# Patient Record
Sex: Male | Born: 1935 | Race: White | Hispanic: No | Smoking: Former smoker
Health system: Southern US, Community
[De-identification: ages and names within clinical notes are randomized; demographics above are authoritative.]

## PROBLEM LIST (undated history)

## (undated) DIAGNOSIS — I10 Essential (primary) hypertension: Secondary | ICD-10-CM

## (undated) DIAGNOSIS — I442 Atrioventricular block, complete: Secondary | ICD-10-CM

## (undated) DIAGNOSIS — N289 Disorder of kidney and ureter, unspecified: Secondary | ICD-10-CM

## (undated) DIAGNOSIS — F17201 Nicotine dependence, unspecified, in remission: Secondary | ICD-10-CM

## (undated) DIAGNOSIS — I050 Rheumatic mitral stenosis: Secondary | ICD-10-CM

## (undated) DIAGNOSIS — M1710 Unilateral primary osteoarthritis, unspecified knee: Secondary | ICD-10-CM

## (undated) DIAGNOSIS — K219 Gastro-esophageal reflux disease without esophagitis: Secondary | ICD-10-CM

## (undated) DIAGNOSIS — I350 Nonrheumatic aortic (valve) stenosis: Secondary | ICD-10-CM

## (undated) DIAGNOSIS — R55 Syncope and collapse: Secondary | ICD-10-CM

## (undated) DIAGNOSIS — I2510 Atherosclerotic heart disease of native coronary artery without angina pectoris: Principal | ICD-10-CM

## (undated) DIAGNOSIS — C801 Malignant (primary) neoplasm, unspecified: Secondary | ICD-10-CM

## (undated) DIAGNOSIS — T1490XA Injury, unspecified, initial encounter: Secondary | ICD-10-CM

## (undated) DIAGNOSIS — E785 Hyperlipidemia, unspecified: Secondary | ICD-10-CM

## (undated) DIAGNOSIS — M179 Osteoarthritis of knee, unspecified: Secondary | ICD-10-CM

## (undated) HISTORY — DX: Nicotine dependence, unspecified, in remission: F17.201

## (undated) HISTORY — DX: Essential (primary) hypertension: I10

## (undated) HISTORY — PX: LUMBAR SPINE SURGERY: SHX701

## (undated) HISTORY — DX: Injury, unspecified, initial encounter: T14.90XA

## (undated) HISTORY — DX: Hyperlipidemia, unspecified: E78.5

## (undated) HISTORY — DX: Nonrheumatic aortic (valve) stenosis: I35.0

## (undated) HISTORY — DX: Atherosclerotic heart disease of native coronary artery without angina pectoris: I25.10

## (undated) HISTORY — DX: Unilateral primary osteoarthritis, unspecified knee: M17.10

## (undated) HISTORY — DX: Syncope and collapse: R55

## (undated) HISTORY — DX: Osteoarthritis of knee, unspecified: M17.9

---

## 1999-12-03 ENCOUNTER — Emergency Department (HOSPITAL_COMMUNITY): Admission: EM | Admit: 1999-12-03 | Discharge: 1999-12-03 | Payer: Self-pay | Admitting: Emergency Medicine

## 1999-12-16 ENCOUNTER — Encounter: Payer: Self-pay | Admitting: Neurosurgery

## 1999-12-16 ENCOUNTER — Ambulatory Visit (HOSPITAL_COMMUNITY): Admission: RE | Admit: 1999-12-16 | Discharge: 1999-12-16 | Payer: Self-pay | Admitting: Neurosurgery

## 2000-01-01 ENCOUNTER — Ambulatory Visit (HOSPITAL_COMMUNITY): Admission: RE | Admit: 2000-01-01 | Discharge: 2000-01-01 | Payer: Self-pay | Admitting: Neurosurgery

## 2000-01-01 ENCOUNTER — Encounter: Payer: Self-pay | Admitting: Neurosurgery

## 2000-01-20 ENCOUNTER — Encounter: Payer: Self-pay | Admitting: Neurosurgery

## 2000-01-22 ENCOUNTER — Encounter: Payer: Self-pay | Admitting: Neurosurgery

## 2000-01-22 ENCOUNTER — Inpatient Hospital Stay (HOSPITAL_COMMUNITY): Admission: RE | Admit: 2000-01-22 | Discharge: 2000-01-26 | Payer: Self-pay | Admitting: Neurosurgery

## 2000-07-28 DIAGNOSIS — I2510 Atherosclerotic heart disease of native coronary artery without angina pectoris: Secondary | ICD-10-CM

## 2000-07-28 HISTORY — DX: Atherosclerotic heart disease of native coronary artery without angina pectoris: I25.10

## 2001-04-20 ENCOUNTER — Inpatient Hospital Stay (HOSPITAL_COMMUNITY): Admission: AD | Admit: 2001-04-20 | Discharge: 2001-04-22 | Payer: Self-pay | Admitting: Cardiology

## 2001-07-28 HISTORY — PX: CORONARY ARTERY BYPASS GRAFT: SHX141

## 2001-10-05 ENCOUNTER — Inpatient Hospital Stay (HOSPITAL_COMMUNITY): Admission: AD | Admit: 2001-10-05 | Discharge: 2001-10-12 | Payer: Self-pay | Admitting: Cardiovascular Disease

## 2001-10-08 ENCOUNTER — Encounter: Payer: Self-pay | Admitting: Surgery

## 2001-10-09 ENCOUNTER — Encounter: Payer: Self-pay | Admitting: Surgery

## 2001-10-10 ENCOUNTER — Encounter: Payer: Self-pay | Admitting: Thoracic Surgery (Cardiothoracic Vascular Surgery)

## 2001-11-11 ENCOUNTER — Inpatient Hospital Stay (HOSPITAL_COMMUNITY): Admission: EM | Admit: 2001-11-11 | Discharge: 2001-11-12 | Payer: Self-pay | Admitting: Emergency Medicine

## 2003-07-29 DIAGNOSIS — T1490XA Injury, unspecified, initial encounter: Secondary | ICD-10-CM

## 2003-07-29 HISTORY — DX: Injury, unspecified, initial encounter: T14.90XA

## 2003-07-29 HISTORY — PX: EXPLORATORY LAPAROTOMY W/ BOWEL RESECTION: SHX1544

## 2004-04-16 ENCOUNTER — Ambulatory Visit: Payer: Self-pay | Admitting: Physical Medicine & Rehabilitation

## 2004-04-16 ENCOUNTER — Inpatient Hospital Stay (HOSPITAL_COMMUNITY): Admission: AC | Admit: 2004-04-16 | Discharge: 2004-05-29 | Payer: Self-pay

## 2004-04-16 ENCOUNTER — Ambulatory Visit: Payer: Self-pay | Admitting: Internal Medicine

## 2004-04-30 ENCOUNTER — Encounter: Payer: Self-pay | Admitting: Cardiovascular Disease

## 2004-06-26 ENCOUNTER — Ambulatory Visit: Payer: Self-pay | Admitting: Family Medicine

## 2004-10-14 ENCOUNTER — Ambulatory Visit: Payer: Self-pay | Admitting: Family Medicine

## 2005-01-14 ENCOUNTER — Ambulatory Visit: Payer: Self-pay | Admitting: Family Medicine

## 2005-03-10 ENCOUNTER — Ambulatory Visit: Payer: Self-pay | Admitting: Cardiology

## 2005-03-13 ENCOUNTER — Ambulatory Visit: Payer: Self-pay | Admitting: Cardiology

## 2005-03-28 ENCOUNTER — Ambulatory Visit: Payer: Self-pay | Admitting: Family Medicine

## 2005-04-16 ENCOUNTER — Ambulatory Visit: Payer: Self-pay | Admitting: Family Medicine

## 2005-04-27 HISTORY — PX: TOTAL KNEE ARTHROPLASTY: SHX125

## 2005-05-09 ENCOUNTER — Inpatient Hospital Stay (HOSPITAL_COMMUNITY): Admission: RE | Admit: 2005-05-09 | Discharge: 2005-05-13 | Payer: Self-pay | Admitting: Orthopedic Surgery

## 2005-06-17 ENCOUNTER — Ambulatory Visit: Payer: Self-pay | Admitting: Family Medicine

## 2005-08-06 ENCOUNTER — Encounter: Admission: RE | Admit: 2005-08-06 | Discharge: 2005-08-06 | Payer: Self-pay | Admitting: Orthopedic Surgery

## 2005-09-17 ENCOUNTER — Ambulatory Visit: Payer: Self-pay | Admitting: Family Medicine

## 2005-10-08 ENCOUNTER — Ambulatory Visit: Payer: Self-pay | Admitting: Family Medicine

## 2006-01-08 ENCOUNTER — Ambulatory Visit: Payer: Self-pay | Admitting: Family Medicine

## 2006-02-09 ENCOUNTER — Ambulatory Visit: Payer: Self-pay | Admitting: Family Medicine

## 2006-03-09 ENCOUNTER — Ambulatory Visit: Payer: Self-pay | Admitting: Family Medicine

## 2006-03-12 ENCOUNTER — Ambulatory Visit: Payer: Self-pay | Admitting: *Deleted

## 2006-03-12 ENCOUNTER — Inpatient Hospital Stay (HOSPITAL_COMMUNITY): Admission: EM | Admit: 2006-03-12 | Discharge: 2006-03-13 | Payer: Self-pay | Admitting: Emergency Medicine

## 2006-04-20 ENCOUNTER — Ambulatory Visit: Payer: Self-pay | Admitting: Family Medicine

## 2006-04-29 ENCOUNTER — Ambulatory Visit: Payer: Self-pay | Admitting: Family Medicine

## 2006-06-08 ENCOUNTER — Ambulatory Visit: Payer: Self-pay | Admitting: Family Medicine

## 2006-09-11 ENCOUNTER — Ambulatory Visit: Payer: Self-pay | Admitting: Family Medicine

## 2006-09-24 ENCOUNTER — Ambulatory Visit (HOSPITAL_COMMUNITY): Admission: RE | Admit: 2006-09-24 | Discharge: 2006-09-24 | Payer: Self-pay | Admitting: Ophthalmology

## 2006-09-26 IMAGING — CT CT ANGIO CHEST
1 series · 19 of 32 positions shown · IV contrast (omniscan)
Comparison: Chest radiograph 03/11/2006.

CLINICAL DATA: Shortness of breath, weakness.  
CT ANGIOGRAPHY OF CHEST:
TECHNIQUE: Multidetector CT imaging of the chest was performed during bolus injection of intravenous contrast.  Multiplanar CT angiographic image reconstructions were generated to evaluate the vascular anatomy.
Contrast:  100 cc Omniscan 300 IV

[Series 4: pulm embolism 2.0 st · axial · 0.69mm/px · z∈[+1267,+1569]mm · 19 of 163 slices shown]
[im 6/163  lung]
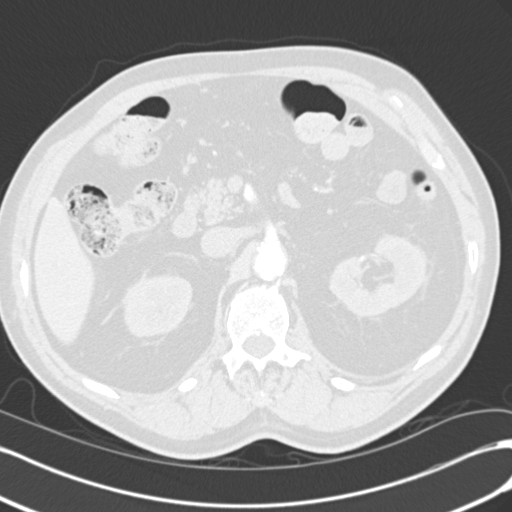
[im 16/163  soft-tissue]
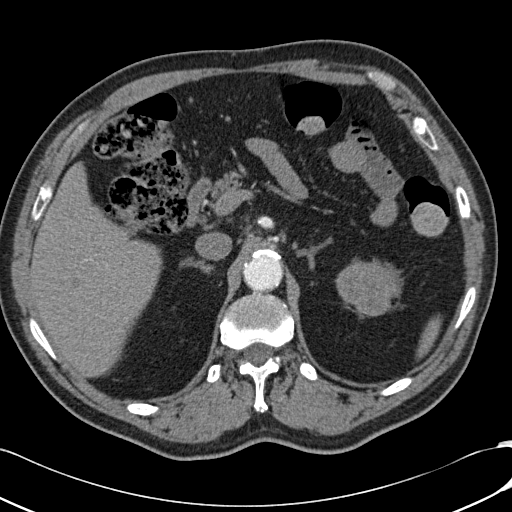
[im 21/163  lung]
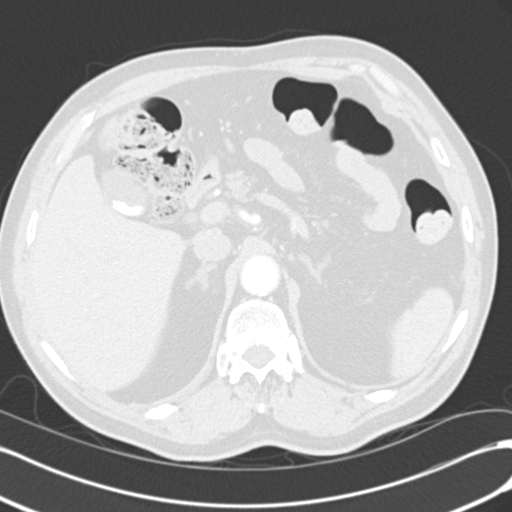
[im 32/163  soft-tissue]
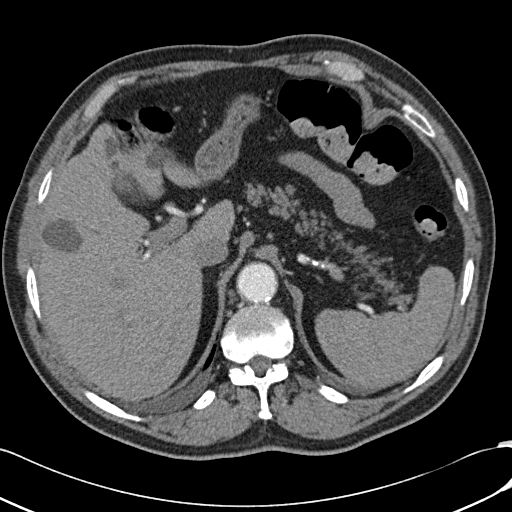
[im 42/163  lung]
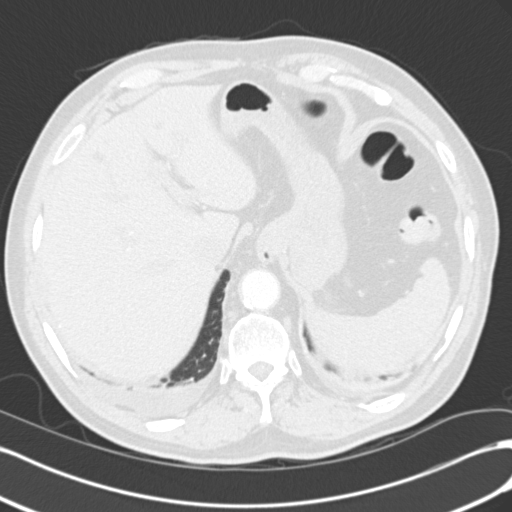
[im 48/163  soft-tissue]
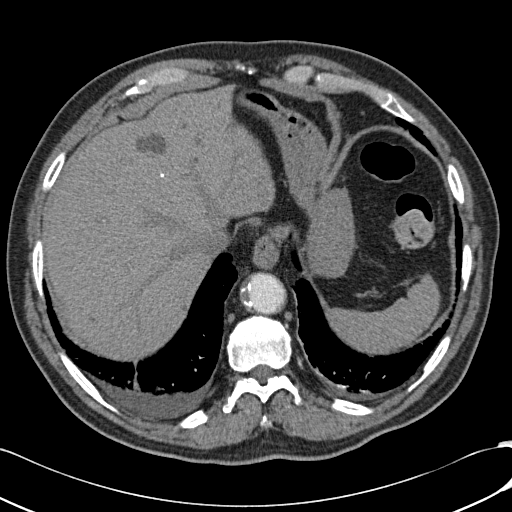
[im 58/163  lung]
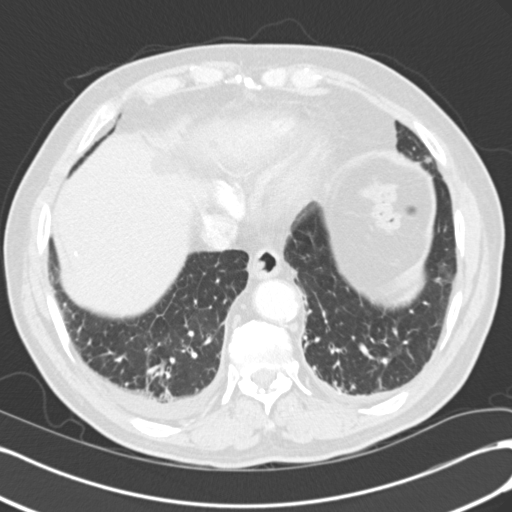
[im 63/163  soft-tissue]
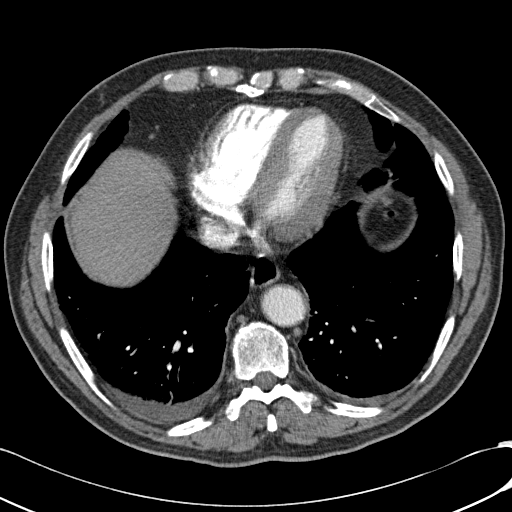
[im 74/163  lung]
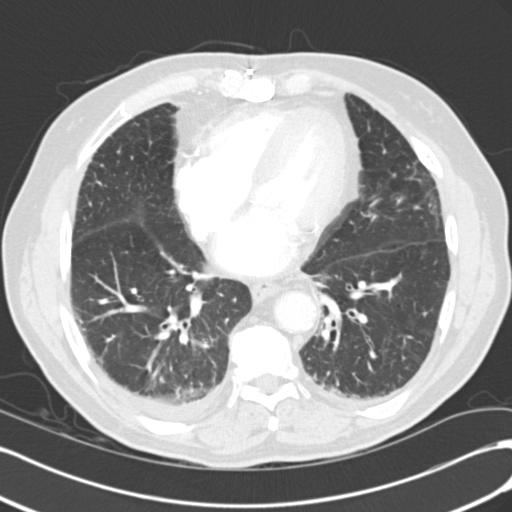
[im 84/163  soft-tissue]
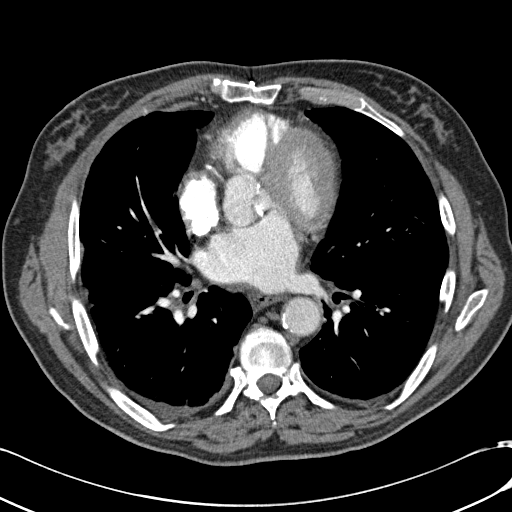
[im 89/163  lung]
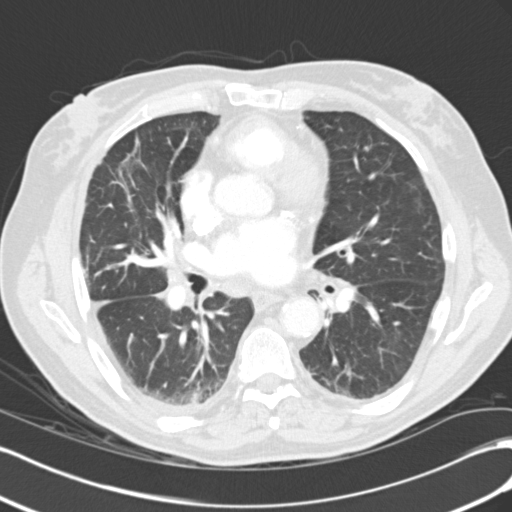
[im 100/163  soft-tissue]
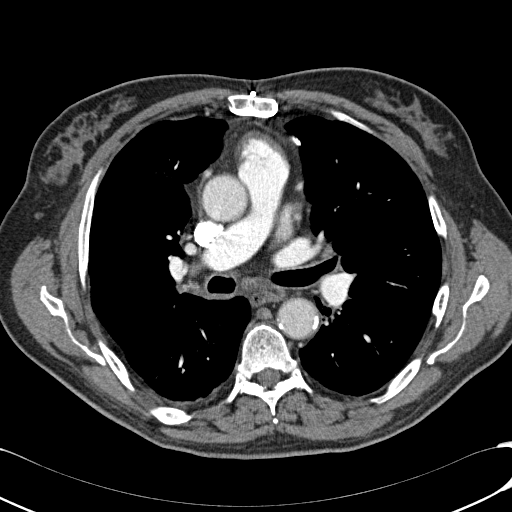
[im 105/163  lung]
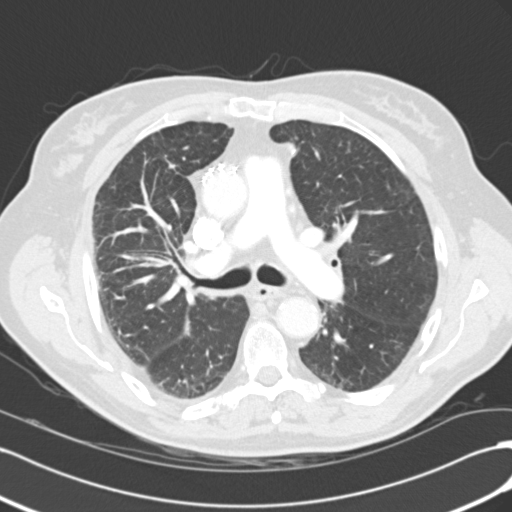
[im 115/163  soft-tissue]
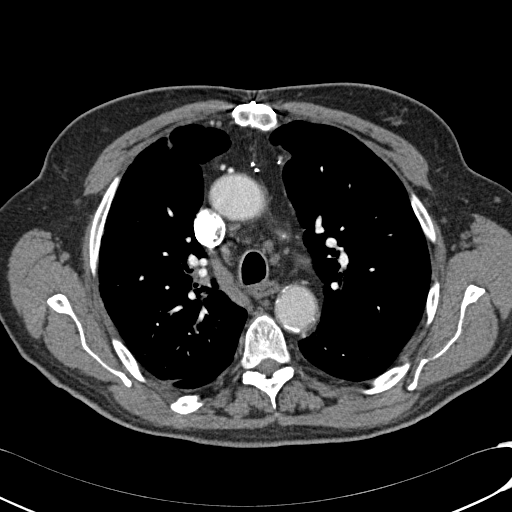
[im 121/163  lung]
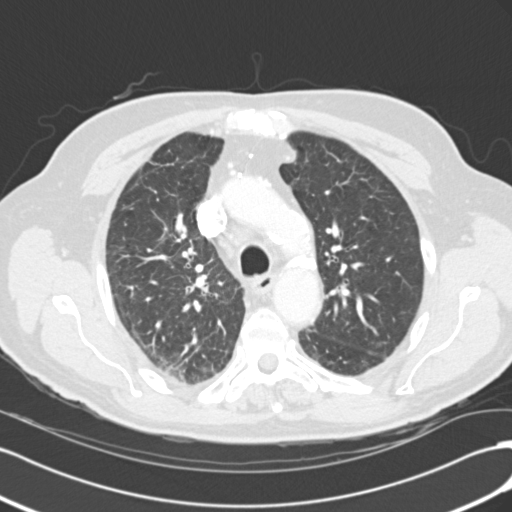
[im 131/163  soft-tissue]
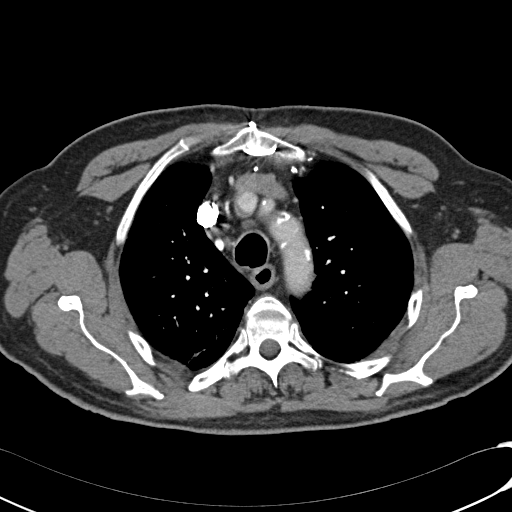
[im 142/163  lung]
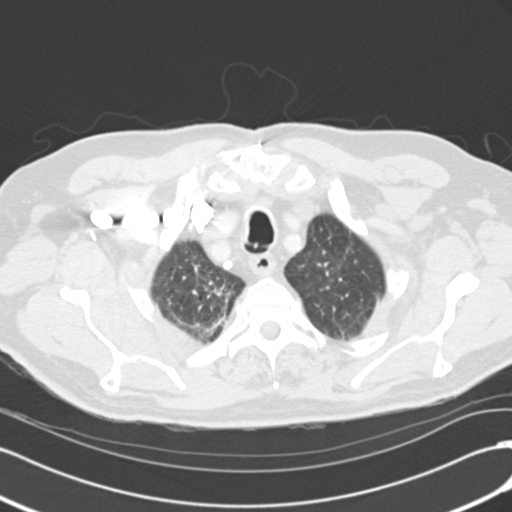
[im 147/163  soft-tissue]
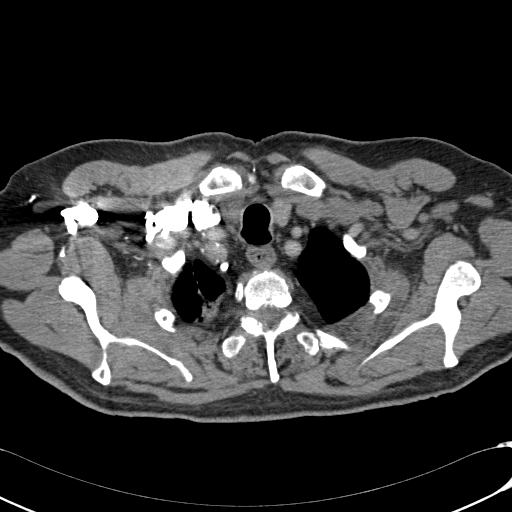
[im 157/163  lung]
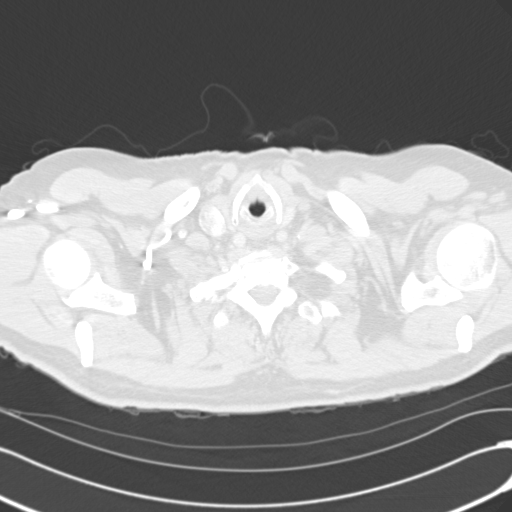

[19 of 32 positions shown; findings below may reference images not displayed]

FINDINGS: The study is adequate for evaluation for pulmonary embolism up to and including the third order pulmonary arteries.  No filling defect is seen to suggest acute pulmonary embolism.   Scattered small mediastinal lymph nodes are noted.   Heart size is normal.  No pericardial effusion. Biapical scarring and emphysematous changes are noted.  There is an 8 mm area of ground-glass opacity versus spiculation in the right middle lobe on image 65, series 5.  5 mm right middle lobe pulmonary parenchymal nodule is noted on image 83, series 5.  Bilateral small pleural effusions are present.  3 mm pulmonary parenchymal nodule in the lingula is noted on image 92.  Patient is status post CABG.  No acute osseous finding. Innumerable hepatic cysts are partly visualized. Gallstones noted.
IMPRESSION: 1.   No acute intrathoracic finding, including pulmonary embolism.  
2.  Right middle lobe pulmonary parenchymal nodules, amenable to 6-month interval CT followup.

## 2006-10-29 ENCOUNTER — Ambulatory Visit: Payer: Self-pay | Admitting: Family Medicine

## 2006-11-27 ENCOUNTER — Ambulatory Visit: Payer: Self-pay | Admitting: Family Medicine

## 2006-12-10 ENCOUNTER — Ambulatory Visit: Payer: Self-pay | Admitting: Cardiology

## 2006-12-24 ENCOUNTER — Ambulatory Visit (HOSPITAL_COMMUNITY): Admission: RE | Admit: 2006-12-24 | Discharge: 2006-12-24 | Payer: Self-pay | Admitting: Cardiology

## 2006-12-24 ENCOUNTER — Ambulatory Visit: Payer: Self-pay | Admitting: Cardiology

## 2006-12-28 ENCOUNTER — Ambulatory Visit: Payer: Self-pay | Admitting: Family Medicine

## 2007-01-04 ENCOUNTER — Ambulatory Visit: Payer: Self-pay | Admitting: Internal Medicine

## 2007-01-07 ENCOUNTER — Ambulatory Visit: Payer: Self-pay | Admitting: Internal Medicine

## 2007-02-08 ENCOUNTER — Ambulatory Visit: Payer: Self-pay | Admitting: Cardiology

## 2007-04-22 ENCOUNTER — Ambulatory Visit (HOSPITAL_COMMUNITY): Admission: RE | Admit: 2007-04-22 | Discharge: 2007-04-22 | Payer: Self-pay | Admitting: Ophthalmology

## 2010-11-26 DIAGNOSIS — R55 Syncope and collapse: Secondary | ICD-10-CM

## 2010-11-26 HISTORY — DX: Syncope and collapse: R55

## 2010-12-10 NOTE — Procedures (Signed)
NAMERICCI, DIROCCO NO.:  [id number]   MEDICAL RECORD NO.:  [id number]          PATIENT TYPE:  OUT   LOCATION:  RAD                           FACILITY:  [facility]   PHYSICIAN:  Gerrit Friends. Dietrich Pates, MD, FACCDATE OF BIRTH:  Oct 06, 1921   DATE OF PROCEDURE:  12/24/2006  DATE OF DISCHARGE:                                ECHOCARDIOGRAM   CLINICAL DATA:  This is an 77 year old gentleman with prior CABG surgery  and a history of aortic stenosis.   1. Technically difficult and limited echocardiographic study.  2. Very mild left atrial enlargement; normal right atrial size.  3. Right ventricle not well seen, but no significant abnormalities      identified.  4. Normal mitral valve with moderate mitral annular calcification.  5. Tricuspid and pulmonic valve not adequately imaged.  6. The aortic root is normal in diameter; there is moderate      calcification of the wall and annulus.  7. The aortic valve is not adequately imaged.  There is sclerosis of      the leaflets.  The possibility of stenosis is not adequately      assessed.  There is no significant insufficiency.  8. Normal proximal pulmonary artery.  9. Normal left ventricular size; no significant increase in wall      thickness; hyperdynamic regional and global function.      Gerrit Friends. Dietrich Pates, MD, [facility]  Electronically Signed     RMR/MEDQ  D:  12/24/2006  T:  12/25/2006  Job:  [id number]

## 2010-12-10 NOTE — Letter (Signed)
Dec 10, 2006    Delaney Meigs, M.D.  [address]  [place], [place] [address]   RE:  HILLIS, MCPHATTER  MRN:  [id number]  /  DOB:  1921-11-24   Dear Marolyn Haller:   It was my pleasure evaluating Mr. Essman in the office today in  consultation at your request.  As you know, this nice gentleman has a  history of coronary disease dating to 2002.  He underwent percutaneous  intervention at that time, but returned with recurrent symptoms and 3-  vessel disease prompting CABG surgery in 2003.  He did well until August  of 2007 when he returned with some exertional dyspnea.  Catheterization  results at that time were excellent, with patent grafts and a normal  ejection fraction.  No gradient was recorded across the aortic valve.  He has continued to do well, keeping a garden and remaining active.  He  reports no dyspnea nor chest discomfort.  There has been no  lightheadedness nor syncope.  A murmur was recently noted in your  office, prompting this assessment.  Mr. Ambler was previously followed by  Dr. Diona Browner, but has not seen him for some time.   The patient has dyslipidemia that has been managed with statins.  He has  hypertension that has been generally well controlled.  He has DJD and  underwent a right total knee arthroplasty in 04/2005.   CURRENT MEDICATIONS:  1. Aspirin 81 mg daily  2. Amlodipine 2.5 mg daily  3. Simvastatin 40 mg daily  4. Metoprolol 25 mg b.i.d.  5. He has nitroglycerine available for p.r.n. use but does not use it.   ALLERGIES:  He has no reported drug allergies.   Past medical, social, and family history were updated, with results as  reported in our chart.   PHYSICAL EXAMINATION:  On exam, very pleasant, sharp older gentleman in  no acute distress.  The weight is 180 pounds.  Blood pressure 160/70,  heart rate 64 and regular, respirations 16.  NECK:  No jugular venous distention; normal carotid upstrokes and  transmitted murmur bilaterally.  HEENT:  Anicteric  sclerae; normal lids and conjunctiva; normal oral  mucosa.  ENDOCRINE:  No thyromegaly.  HEMATOPOIETIC:  No adenopathy.  SKIN:  No significant lesions.  PSYCHIATRIC:  Alert and oriented; normal affect.  LUNGS:  Clear.  CARDIAC:  Normal first heart sound; reduced aortic component of the  second heart sound; grade 3/6 early to mid peaking systolic ejection  murmur at the cardiac base.  ABDOMEN:  Soft and nontender; no masses; no organomegaly; normal bowel  sounds without bruits.  Aortic pulsation not palpable.  EXTREMITIES:  1+ edema on the right; 1/2+ on the left; distal pulses  intact.  NEUROMUSCULAR:  Symmetric strength and tone; normal cranial nerves.   Recent chemistry profile reported from your office was normal.   IMPRESSION:  Mr. Barradas is doing well in general.  He certainly has a  degree of aortic stenosis by examination.  The fact that there is no  gradient across the valve in 2007 indicates that an error was made  during that procedure or that stenosis is very mild.  We will obtain an  echocardiogram to further evaluate the degree of aortic valve disease  and to reassess left ventricular systolic function.  A fasting lipid  profile will be obtained.  The patient will ultimately require carotid  ultrasound as well to make certain that his transmitted murmur is not  obscuring evidence for  cerebrovascular disease.  He and his family were  cautioned to report the development of chest discomfort, dyspnea or  syncope.  I believe that it will be quite a few years before his  valvular heart disease becomes symptomatic.  I will plan to follow him  annually for this.   Blood pressure is somewhat elevated.  The patient will collect  additional values and return to see the cardiology nurse in one month  for reassessment of the adequacy of hypertension management.    Sincerely,     Gerrit Friends. Dietrich Pates, MD, [facility]   RMR/MedQ  DD: 12/10/2006  DT: 12/10/2006  Job #: [id number]

## 2010-12-12 ENCOUNTER — Emergency Department (HOSPITAL_COMMUNITY): Payer: PRIVATE HEALTH INSURANCE

## 2010-12-12 ENCOUNTER — Inpatient Hospital Stay (HOSPITAL_COMMUNITY)
Admission: EM | Admit: 2010-12-12 | Discharge: 2010-12-13 | DRG: 312 | Disposition: A | Discharge status: Home / Self Care | Payer: PRIVATE HEALTH INSURANCE | Attending: Cardiology | Admitting: Cardiology

## 2010-12-12 DIAGNOSIS — R079 Chest pain, unspecified: Secondary | ICD-10-CM | POA: Diagnosis present

## 2010-12-12 DIAGNOSIS — I359 Nonrheumatic aortic valve disorder, unspecified: Secondary | ICD-10-CM | POA: Diagnosis present

## 2010-12-12 DIAGNOSIS — I2510 Atherosclerotic heart disease of native coronary artery without angina pectoris: Secondary | ICD-10-CM | POA: Diagnosis present

## 2010-12-12 DIAGNOSIS — E785 Hyperlipidemia, unspecified: Secondary | ICD-10-CM | POA: Diagnosis present

## 2010-12-12 DIAGNOSIS — R55 Syncope and collapse: Principal | ICD-10-CM | POA: Diagnosis present

## 2010-12-12 DIAGNOSIS — Z96659 Presence of unspecified artificial knee joint: Secondary | ICD-10-CM

## 2010-12-12 DIAGNOSIS — I10 Essential (primary) hypertension: Secondary | ICD-10-CM | POA: Diagnosis present

## 2010-12-12 DIAGNOSIS — Z951 Presence of aortocoronary bypass graft: Secondary | ICD-10-CM

## 2010-12-12 LAB — COMPREHENSIVE METABOLIC PANEL
ALT: 12 U/L (ref 0–53)
AST: 17 U/L (ref 0–37)
BUN: 23 mg/dL (ref 6–23)
Calcium: 8.9 mg/dL (ref 8.4–10.5)
Chloride: 98 mEq/L (ref 96–112)
GFR calc Af Amer: >60 mL/min (ref >60)
GFR calc non Af Amer: 52 mL/min — ABNORMAL LOW (ref >60)
Glucose, Bld: 122 mg/dL — ABNORMAL HIGH (ref 70–99)
Potassium: 4.4 mEq/L (ref 3.5–5.1)
Total Bilirubin: 0.6 mg/dL (ref 0.3–1.2)

## 2010-12-12 LAB — CARDIAC PANEL(CRET KIN+CKTOT+MB+TROPI)
CK, MB: 3.6 ng/mL (ref 0.3–4.0)
Relative Index: 3 — ABNORMAL HIGH (ref 0.0–2.5)

## 2010-12-12 LAB — CBC
MCH: 31.7 pg (ref 26.0–34.0)
MCV: 92.7 fL (ref 78.0–100.0)
Platelets: 250 10*3/uL (ref 150–400)
RBC: 4.36 MIL/uL (ref 4.22–5.81)
WBC: 10 10*3/uL (ref 4.0–10.5)

## 2010-12-12 LAB — URINALYSIS, ROUTINE W REFLEX MICROSCOPIC
Glucose, UA: NEGATIVE mg/dL
Hgb urine dipstick: NEGATIVE
Specific Gravity, Urine: 1.014 (ref 1.005–1.030)

## 2010-12-12 LAB — POCT CARDIAC MARKERS
Myoglobin, poc: 200 ng/mL (ref 12–200)
Troponin i, poc: <0.05 ng/mL (ref 0.00–0.09)

## 2010-12-13 DIAGNOSIS — I517 Cardiomegaly: Secondary | ICD-10-CM

## 2010-12-13 LAB — LIPID PANEL
Cholesterol: 108 mg/dL (ref 0–200)
LDL Cholesterol: 62 mg/dL (ref 0–99)
Triglycerides: 70 mg/dL (ref <150)
VLDL: 14 mg/dL (ref 0–40)

## 2010-12-13 LAB — CARDIAC PANEL(CRET KIN+CKTOT+MB+TROPI)
CK, MB: 3.4 ng/mL (ref 0.3–4.0)
Relative Index: 3.1 — ABNORMAL HIGH (ref 0.0–2.5)
Troponin I: <0.3 ng/mL (ref <0.30)
Troponin I: <0.3 ng/mL (ref <0.30)

## 2010-12-13 NOTE — H&P (Signed)
[place]. [facility]  Patient:    [name], [name]                        MRN: [id number] Adm. Date:  [id number] Attending:  Coletta Memos Dictator:   Mena Goes. Franky Macho, M.D.                         History and Physical  No dictation DD:  01/22/00 TD:  01/22/00 Job: [id number]

## 2010-12-13 NOTE — Cardiovascular Report (Signed)
[place]. [facility]  Patient:    DIAZ, CRAGO Visit Number: [id number] MRN: [id number]          Service Type: MED Location: [phone] Attending Physician:  Nelta Numbers Dictated by:   Daisey Must, M.D. [facility] Proc. Date: 04/21/01 Admit Date:  04/20/2001   CC:         Colon Flattery, D.O.  [facility]  Cardiac Catheterization Lab   Cardiac Catheterization  PROCEDURE:  Percutaneous transluminal coronary angioplasty with stent placement in the proximal left anterior descending artery.  CARDIOLOGIST:  Daisey Must, M.D. [facility]  INDICATION:  Mr. Morken is a 77 year old male who presented with chest pain and ruled in for a small non-Q-wave myocardial infarction.  Cardiac catheterization today by Dr. Andee Lineman revealed a 90% stenosis in the proximal LAD and a complex diffuse 90% stenosis in the proximal and mid right coronary artery.  The right coronary artery is a relatively small vessel.  We opted to proceed with percutaneous coronary intervention of the LAD.  CATHETERIZATION PROCEDURAL NOTE:  A preexisting 6-French sheath in the right femoral artery was exchanged over a wire for a 7-French sheath.  Heparin and Integrilin were administered per protocol.  We used a 7-French JL4 guiding catheter and BMW wire.  The lesion was predilated with a 2.5 x 15 mm Quantum balloon at a pressure of 15 atmospheres.  We then deployed a 2.75 x 16 mm Elite II stent at a deployment pressure of 14 atmospheres.  Following this, intracoronary nitroglycerin was administered.  The patient then experienced what appeared to be a vagal episode with hypotension and bradycardia.  He was given atropine and started on dopamine for a brief period of time.  With is drop in blood pressure, his antegrade flow in the LAD diminished.  However, after his blood pressure was restored to normal, his LAD flow improved.  He was also administered intracoronary verapamil.   The patient then recovered normal heart rate and blood pressure.  He subsequently remained stable and was free of chest pain.   Final angiographic images revealed patency of the LAD with 0% residual stenosis and TIMI-3 flow.  COMPLICATIONS:  None.  RESULTS:  Successful percutaneous transluminal coronary angioplasty and stent placement in the proximal left anterior descending artery reducing a 90% stenosis to 0% residual with TIMI-3 flow.  PLAN:  Integrilin will be continued for 18 hours.  Plavix will be administered for four weeks.  It is recommended the patient be treated medically for his residual disease in the right coronary artery. Dictated by:   Daisey Must, M.D. [facility] Attending Physician:  Nelta Numbers DD:  04/21/01 TD:  04/21/01 Job: [id number]

## 2010-12-13 NOTE — Discharge Summary (Signed)
[place]. [facility]  Patient:    [name], [name] Visit Number: [id number] MRN: [id number]          Service Type: MED Location: [phone] Attending Physician:  Nelta Numbers Dictated by:   Tereso Newcomer, P.A. Admit Date:  04/20/2001 Disc. Date: 04/22/01   CC:         [facility] [place], [place]  Dr. Dewaine Conger   Discharge Summary  DATE OF BIRTH:  08/11/1921  DISCHARGE DIAGNOSES: 1. Status post small non-Q-wave myocardial infarction. 2. Two-vessel coronary artery disease.    a. Status post percutaneous transluminal coronary angioplasty/stent of       proximal left anterior descending this admission by Dr. Loraine Leriche Pulsipher.    b. Right coronary artery disease with 90% proximal stenosis and 80% diffuse       midstenosis and small diffusely diseased posterolateral -- medical       therapy. 3. Dyslipoproteinemia (LDL 115, HDL 33, triglycerides 137 and cholesterol    175). 4. Hypertension. 5. History of lower extremity edema. 6. History of congestive heart failure secondary to diastolic dysfunction.  PROCEDURES PERFORMED THIS ADMISSION: 1. Cardiac catheterization by Dr. Lewayne Bunting on April 21, 2001 revealing    left main with no flow-limiting coronary artery disease; left anterior    descending -- 90% proximal with calcification, 90% proximal/ostial diagonal    #1, left circumflex with moderate disease -- 40% proximal, 40% mid/50% mid,    with aneurysmal appearance; right coronary artery with 90% proximal,    80% diffuse mid-right coronary artery; posterolateral -- small, diffusely    diseased with faint filling of the posterior descending artery, ejection    fraction 60%. 2. Percutaneous coronary intervention by Dr. Daisey Must on    April 21, 2001.  HOSPITAL COURSE:  This 77 year old male was originally seen at [facility] by Dr. Jonelle Sidle on April 19, 2001.  The patient complained of feelings of dizziness  while working outside.  He developed diaphoresis.  He denied chest pain.  He felt presyncopal with profuse diaphoresis.  He came to the hospital.  His EKG had no acute changes.  His initial troponin was 0.28.  The patient was evaluated further with followup enzymes.  He was placed on IV heparin and transferred to [facility] for further evaluation.  He ruled in for a small non-Q-wave myocardial infarction by troponins.  On April 21, 2001, he went for cardiac catheterization by Dr. Andee Lineman, as noted above.  He tolerated procedure well and had no immediate complications.  Following catheterization, he went for percutaneous coronary intervention by Dr. Loraine Leriche Pulsipher.  As noted above, he had successful stenting of the proximal LAD with reduction in stenosis from 90% to 0% with restoration of TIMI-3 flow.  He tolerated this procedure well without immediate complications.  Cardiac rehab saw the patient on April 22, 2001.  He ambulated without chest pain.  Dr. Gerrit Friends. Rothbart saw the patient on September 26th and felt he was ready for discharge to home. He had noted that his cath site was normal and all his labs were normal.  He would be sent home on his usual medications.  In addition, he will be sent home on Plavix for a month.  As noted above, his LDL was 115 at admission. Decision whether or not to start statin therapy will be made at his followup appointment.  LABORATORY DATA:  WBC 9400, hemoglobin 14.7, hematocrit 42.2, platelet count 212,000.  INR 1.0.  Sodium 139, potassium 3.9, chloride 102, CO2 31, glucose 121, BUN 16, creatinine 1.1, total bilirubin 0.7, alkaline phosphatase 65, AST 24, ALT 23, total protein 6.8, albumin 3.5, calcium 8.8.  Labs performed at [facility] revealed a total CK of 181 and CK-MB of 8.3 and troponin I of 0.28 upon admission.  Followup CK-MBs were negative at [facility].  Troponin I on September 23rd at [facility] was 0.12.  At  [facility], CK-MB on September 25th was negative.  Chest x-ray at [facility]:  COPD, no active lung disease.  DISCHARGE MEDICATIONS: 1. Plavix 75 mg q.d. for one month. 2. Coated aspirin 325 mg q.d. 3. Atacand 16 mg q.d. 4. Nitroglycerin p.r.n. chest pain.  ACTIVITY:  No driving for two to three days.  No heavy lifting, greater than 10 pounds, exertion or work for one week.  DIET:  Low fat, low sodium.  SPECIAL DISCHARGE INSTRUCTIONS:  Patient is to call our office in [place] for any increased swelling, bleeding or bruising at his cath site.  FOLLOWUP:  He is to follow up with Dr. Diona Browner next Wednesday, October 2nd, at 10:45 a.m. at the [facility] in [place].  As noted above, decision for initiation of statin therapy will be made at followup appointment. Dictated by:   Tereso Newcomer, P.A. Attending Physician:  Nelta Numbers DD:  04/22/01 TD:  04/22/01 Job: [id number]

## 2010-12-13 NOTE — Procedures (Signed)
NAMEBARNABY, RIPPEON NO.:  [id number]   MEDICAL RECORD NO.:  [id number]          PATIENT TYPE:  OUT   LOCATION:  RAD                           FACILITY:  [facility]   PHYSICIAN:  Pricilla Riffle, MD, FACCDATE OF BIRTH:  [birth year]   DATE OF PROCEDURE:  01/07/2007  DATE OF DISCHARGE:                                ECHOCARDIOGRAM   REFERRING PHYSICIAN:  Delaney Meigs, M.D.   TEST INDICATION:  An 77 year old, history of aortic stenosis.   A 2-D echocardiogram with echo Doppler.   Low parasternal views.  Left ventricle appears slightly small in size  with an end-diastolic dimension of 30 mm.  Interventricular septum and  posterior wall are mildly thickened.   Left atrium, right atrium and right ventricle are grossly normal.  Aortic root is normal.   The aortic valve is mildly thickened, calcified.  There appears to be  mildly restricted motion with a mean gradient of 15 mmHg.   Mitral valve is mildly thickened with annular calcification.  There is  trace insufficiency.   Pulmonic valve is grossly normal.   Tricuspid valve is normal with no insufficiency.   Overall LV systolic function is vigorous with near cavity obliteration.  There is evidence of diastolic dysfunction.   RV EF is normal.   No pericardial effusion is seen.   Since report of Dec 24, 2006, no significant change.  Note that was a  more limited examination.      Pricilla Riffle, MD, [facility]  Electronically Signed     PVR/MEDQ  D:  01/07/2007  T:  01/08/2007  Job:  [id number]   cc:   Delaney Meigs, M.D.  Fax: [phone]
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
## 2010-12-13 NOTE — Op Note (Signed)
NAMEAURELIUS, [name] NO.:  [id number]   MEDICAL RECORD NO.:  [id number]          PATIENT TYPE:  INP   LOCATION:  2101                         FACILITY:  [facility]   PHYSICIAN:  Danice Goltz, M.D. [facility] OF BIRTH:  [birth year]   DATE OF PROCEDURE:  04/19/2004  DATE OF DISCHARGE:                                 OPERATIVE REPORT   PROCEDURE:  Bronchoscopy.   SURGEON:  Danice Goltz, M.D.   This is an 77 year old [place] male in the trauma service.  We were requested  to see for failure to wean.  Chest x-ray reveals atelectasis and potential  collapse of his right lower lobe.  Because of this, bronchoscopy was  indicated to rule out mucus plugging with collapse.  The patient was already  on the ventilator.  He was premedicated with Versed 5 mg IV and fentanyl 50  mcg. Consent was obtained from the patient and his son.  The patient had the  Pentax fiberoptic bronchoscope advanced via a Portex adaptor through the  existing ET tube.  The ET tube was noted to be at 4 cm above the carina.   Copious secretions could be seen at the level of the carina occluding the  whole entire right mainstem bronchus.  These were purulent secretions.  A  mucus drop was then placed, and the purulent secretions were collected for  microbiology analysis.  At this point, Mucomyst was instilled, and  suctioning was done of the copious secretions.  Numerous mucus plugs were  suctioned.  The bronchoscope had to be retrieved several times to remove  large mucus plugs.  At this point, the bronchoscope was brought to the  carina and advanced to the left mainstem bronchus where the procedure was  again repeated.  Numerous mucus plugs were suctioned particularly from the  left lower lobe as well as from the lingual subsegment.  At this point, the  bronchoscope was brought back to the carinal level, and noting that there  was no untoward complications, it was removed and the procedure terminated.  The  patient tolerated the procedure well. He remained on 100% FIO2 on the  ventilator.  The FIO2 will be decreased as he tolerates.   IMPRESSION:  Severe mucus plugging with extensive mucus plugging in the  right lung causing right lower lobe collapse.   PLAN:  1.  Continue ventilatory support for now.  2.  Add nebulizer for mucociliary clearance and pulmonary toilet.  3.  Consider restarting wean protocol in the a.m. as he tolerates.  4.  Further plans pending response to above.       LG/MEDQ  D:  04/19/2004  T:  04/21/2004  Job:  [id number]   cc:   Jimmye Norman III, M.D.  [address]  [place]  [place] [address]  Fax: [phone]

## 2010-12-13 NOTE — H&P (Signed)
NAMEDONZELL, COLLER NO.:  [id number]   MEDICAL RECORD NO.:  [id number]          PATIENT TYPE:  EMS   LOCATION:  MAJO                         FACILITY:  [facility]   PHYSICIAN:  Rod Holler, MD     DATE OF BIRTH:  [birth year]   DATE OF ADMISSION:  03/11/2006  DATE OF DISCHARGE:                                HISTORY & PHYSICAL   PRIMARY CARDIOLOGIST:  Jonelle Sidle, M.D.   CHIEF COMPLAINT:  Shortness of breath.   HISTORY OF PRESENT ILLNESS:  Mr. Raatz is an 77 year old male, with history  of coronary artery disease, status post coronary artery bypass grafting,  hypertension, and hyperlipidemia, presents to the emergency department with  complaints of shortness of breath. The patient had a couple week history of  dyspnea on exertion, unable to walk completely down the hallway without  shortness of breath.  The patient also has complaints of lightheadedness  with standing.  No complaints of chest pain.  No PND or orthopnea, no lower  extremity swelling.  The patient has had no episodes of syncope. No  palpitations.  At home, the patient has also been noted today to have an  elevated blood pressure.  The patient has no complaints of headache, no  visual changes, no slurred speech.   PAST MEDICAL HISTORY:  1. Coronary artery disease, status post coronary artery bypass grafting.      LIMA to the LAD, saphenous vein graft to the diagonal, saphenous vein      graft to obtuse marginal, saphenous vein graft to RCA.  2. Hyperlipidemia.  3. Hypertension.  4. Osteoarthritis.  5. Status post right knee arthroplasty.   MEDICATIONS:  1. Lipitor 20 mg p.o. daily.  2. DynaCirc 5 mg p.o. daily.  3. Toprol-XL 25 mg p.o. daily.  4. Aspirin 81 mg p.o. daily.   ALLERGIES:  No known drug allergies.   SOCIAL HISTORY:  The patient is a former smoker.  Is married.   FAMILY HISTORY:  A brother is with history of coronary artery disease.   REVIEW OF SYSTEMS:  All systems  are reviewed in detail and are negative  except has noted in the history of present illness.   PHYSICAL EXAMINATION:  VITAL SIGNS:  Heart rate 84, respiratory rate 16,  oxygen saturation 94% on room air, blood pressure 190/70. Repeat blood  pressure 176/76.  GENERAL:  Elderly male, alert and oriented x3.  In no apparent distress.  HEENT:  Atraumatic, normocephalic.  Pupils are equal, round and reactive to  light.  Extraocular movements intact.  Oropharynx clear.  Mucous membranes  moist.  NECK:  Supple.  No adenopathy.  No JVD.  No carotid bruits.  CHEST:  Lungs clear to auscultation bilaterally with equal bilateral breath  sounds.  CARDIOVASCULAR:  Regular rhythm, bradycardic, 2/6 systolic ejection murmur  heard best at the right upper sternal border.  2+ peripheral pulses.  No  femoral bruits.  ABDOMEN:  Soft, nontender, nondistended.  Active bowel sounds.  No  hepatosplenomegaly.  EXTREMITIES:  Trace lower extremity edema without clubbing or cyanosis.  NEUROLOGIC:  No focal deficits.  SKIN: No rashes.  PSYCHIATRIC:  Normal affect.   LABORATORY DATA:  CK-MB 1.5, troponin less than 0.05.  D-dimer 0.91.  Ferraz  blood cell count 7.1, hematocrit 38.6, platelet count 213.  Sodium 143,  potassium 4.4, chloride 109, bicarb 28, BUN 19, creatinine 1, glucose 96.  EKG shows a sinus bradycardia with a right bundle branch block, left atrial  enlargement.  Chest x-ray with cardiomegaly with left lower lobe  atelectasis.   IMPRESSION:  Mr. Sitzman is an 77 year old male with known coronary artery  disease who presents with dyspnea on exertion along with lightheadedness  with standing.   PLAN:  1. Cardiovascular.  Stable EKG.  Rule out with serial cardiac enzymes.      Continue aspirin, statin, beta blocker.  No anticoagulation is needed      at this time.  Admit the patient to a telemetry bed, check orthostatic      vital signs, transthoracic echocardiogram, BNP, lipid panel.  2. Endocrine.   Check thyroid function tests.  3. Fluids, electrolytes, and nutrition.  NPO.  Get a CMP, magnesium level.  4. Pulmonary.  Followup CT scan of the chest which was ordered in the      emergency department.      Rod Holler, MD  Electronically Signed     TRK/MEDQ  D:  03/12/2006  T:  03/12/2006  Job:  [id number]
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
## 2010-12-13 NOTE — Op Note (Signed)
[place]. [facility]  Patient:    [name], [name] Visit Number: [id number] MRN: [id number]          Service Type: MED Location: 2000 2033 01 Attending Physician:  Cleatrice Burke Dictated by:   Alleen Borne, M.D. Proc. Date: 10/08/01 Admit Date:  10/05/2001   CC:         Theron Arista C. Eden Emms, M.D. [facility]  Cath lab   Operative Report  PREOPERATIVE DIAGNOSIS:  Severe three-vessel coronary artery disease.  POSTOPERATIVE DIAGNOSIS:  Severe three-vessel coronary artery disease.  OPERATION PERFORMED:  Median sternotomy, extracorporeal circulation, coronary artery bypass graft surgery x 5 using a left internal mammary artery graft to the left anterior descending coronary artery, with a saphenous vein graft to the diagonal branch of the left anterior descending, a sequential saphenous vein graft to the first and second obtuse marginal branches of the left circumflex coronary artery, and a saphenous vein graft to the distal right coronary artery.  Endovein harvest from the right thigh.  SURGEON:  Alleen Borne, M.D.  ASSISTANT:  Areta Haber, P.A.  ANESTHESIA:  General endotracheal.  INDICATIONS FOR PROCEDURE:  The patient is a 77 year old gentleman with a history of coronary artery disease status post non-Q wave myocardial infarction who underwent stenting of the proximal LAD in September of 2002. He did well until September 16, 2001 when he presented with recurrent substernal chest burning similar to his previous symptoms.  He underwent an adenosine Cardiolite scan on September 22, 2001 that showed reversible anterior ischemia.  There was also inferior wall ischemia.  Ejection fraction was 56%. Cardiac catheterization showed 30% left main stenosis.  The LAD had 95% proximal restenosis within the stent adjacent to the left main coronary artery.  There was a diagonal branch that had 70% stenosis. The left circumflex had 60% proximal and 40% distal  stenosis.  There was a first marginal with 70 to 80% stenosis.  The right coronary artery had 80% midstenosis.  Left ventricular ejection fraction was 60%.  There was no gradient across the aortic valve.  After review of the angiograms and examination of the patient it was felt that coronary artery bypass surgery was the best treatment.  I discussed the operative procedure with the patient and his son including alternatives to surgery, benefits, and risks including bleeding, possible blood transfusion, infection, stroke, myocardial infarction, and death.  They understood and agreed to proceed with surgery.  DESCRIPTION OF PROCEDURE:  The patient was taken to the operating room and placed on the table in supine position.  After induction of general endotracheal anesthesia, a Foley catheter was placed in the bladder using sterile technique.  Then the chest, abdomen and both lower extremities were prepped and draped in the usual sterile manner.  The chest was entered through a median sternotomy incision and the pericardium opened in the midline. Examination of the heart showed good ventricular contractility.  The ascending aorta had no palpable plaques in it.  Then the left internal mammary artery was harvested from the chest wall as a pedicle graft.  This was a medium caliber vessel with excellent blood flow through it.  At the same time a segment of greater saphenous vein was harvested from the right thigh using the Endovein technique.  This was a medium to large caliber vein and of good quality.  Then the patient was heparinized and when an adequate activated clotting time was achieved, the distal ascending aorta was cannulated using a 6.5  mm aortic cannula for arterial inflow.  Venous outflow was achieved using a two-stage venous cannula through the right atrial appendage.  An antegrade cardioplegia and vent cannula was inserted into the aortic root.  The patient was placed on  cardiopulmonary bypass and the distal coronary arteries were identified.  The LAD was a large graftable vessel.  The diagonal branch was small but graftable.  The first obtuse marginal was a medium-sized graftable vessel.  The second marginal was a large graftable vessel.  The right coronary artery was a nondominant vessel that was large distally.  Then the aorta was cross-clamped and 500 cc of cold blood antegrade cardioplegia was administered in the aortic root with quick arrest of the heart.  Systemic hypothermia to 20 degrees centigrade and topical hypothermia with iced saline was used.  A temperature probe was placed in the septum and an insulating pad in the pericardium.  The first distal anastomosis was performed to the first marginal branch.  The internal diameter was about 1.6 mm.  The conduit used was a segment of greater saphenous vein.  Anastomosis was performed in sequential side-to-side manner using continuous 7-0 Prolene suture.  The flow was measured through the graft and was excellent.  The second distal anastomosis was performed to the second marginal branch. The internal diameter was about 2 mm.  The conduit used was the same segment of greater saphenous vein.  The anastomosis was performed in a sequential end-to-side manner using continuous 7-0 Prolene suture.  The flow was measured through the graft and was excellent.  The third distal anastomosis was performed to the diagonal branch.  The internal diameter was 1.5 mm.  The conduit used was the second segment of greater saphenous vein.  The anastomosis was performed in an end-to-side manner using continuous 7-0 Prolene suture.  The flow was measured through the graft and was excellent.  Then a dose of cardioplegia was given down the vein grafts and into the aortic root.  The fourth distal anastomosis was performed to the distal right coronary  artery.  The internal diameter was 1.75 mm.  The conduit used was a  third segment of greater saphenous vein.  The anastomosis was performed in an end-to-side manner using continuous 7-0 Prolene suture.  The flow was measured through the graft and was excellent.  The fifth distal anastomosis was performed to the midportion of the left anterior descending coronary artery.  The internal diameter was about 2 mm. The conduit used was the left internal mammary artery graft and this was brought through an opening in the left pericardium anterior to the phrenic nerve.  It was anastomosed to the LAD in end-to-side manner using continuous 8-0 Prolene suture.  The pedicle was tacked to the epicardium with 6-0 Prolene sutures.  The patient was rewarmed to 37 degrees centigrade and a clamp removed from the mammary artery pedicle.  There was rapid warming of the ventricular septum and return of spontaneous ventricular fibrillation.  The crossclamp was removed with a time of 63 minutes and the patient defibrillated into sinus rhythm.  A  partial occlusion clamp was placed on the aortic root and three proximal vein graft anastomoses were performed in an end-to-side manner using continuous 6-0 Prolene suture.  The clamps were removed, the vein grafts deaired and the clamps removed from them.  The proximal and distal anastomoses appeared hemostatic and the line of the grafts satisfactory.  Graft markers were placed around the proximal anastomoses.  Two temporary right ventricular  and right atrial pacing wires were placed and brought out through the skin.  When the patient had rewarmed to 37 degrees centigrade, he was weaned from cardiopulmonary bypass on no inotropic agents.  Total bypass time was 105 minutes.  Cardiac function appeared excellent with a cardiac  output of 6L a minute.  Protamine was given and the venous and aortic cannulas were removed without difficulty.  Hemostasis was achieved.  The patient was given 8 units of platelets for coagulopathy.  Three chest  tubes were placed with a tube in the posterior pericardium and one in the left pleural space and one in the anterior mediastinum.  The pericardium was reapproximated over the heart.  The sternum was closed with #6 stainless steel wires.  The fascia was closed with continuous #1 Vicryl suture.  The subcutaneous tissues were closed using continuous 2-0 Vicryl and the skin with 3-0 Vicryl subcuticular closure.  The lower extremity vein harvest site was closed in layers in a similar manner with a Blake drain placed in the subcutaneous tunnel in the thigh.  The sponge, needle and instrument counts were correct according to the scrub.  Dry sterile dressings were applied over the incisions and around the chest tubes which were hooked to Pleur-evac suction.  The patient was transported to the SICU in guarded but stable condition. Dictated by:   Alleen Borne, M.D. Attending Physician:  Cleatrice Burke DD:  10/08/01 TD:  10/11/01 Job: [id number]

## 2010-12-13 NOTE — Consult Note (Signed)
[place]. [facility]  Patient:    [name], [name] Visit Number: [id number] MRN: [id number]          Service Type: CAT Location: 4700 4739 01 Attending Physician:  Colon Branch Dictated by:   Alleen Borne, M.D. Admit Date:  10/05/2001   CC:         Colon Flattery, D.O.   Consultation Report  REFERRING PHYSICIAN:  Noralyn Pick. Eden Emms, M.D.  REASON FOR CONSULTATION:  Severe three vessel coronary artery disease with high grade proximal LAD restenosis.  HISTORY OF PRESENT ILLNESS:  This patient is a 77 year old [name] male with a history of coronary artery disease status post non Q-wave myocardial infarction who underwent stenting of the proximal LAD in September 2002.  He did well until September 16, 2001 when he presented with recurrent substernal chest burning similar to his previous symptoms.  This has occurred particularly when walking in cold air and when going out to feed his dogs.  He reports the discomfort as a burning substernal discomfort of moderate severity that resolves with rest after a few minutes.  He underwent an Adenosine Cardiolite study on September 22, 2001 that showed reversible anterior ischemia and also inferior wall ischemia.  Ejection fraction was 56%.  He was referred for cardiac catheterization which was performed today.  This shows 30% left main stenosis.  The LAD had 95% proximal restenosis within the stent adjacent to the left main artery.  There was a diagonal branch that had 70% stenosis. Left circumflex had 60% proximal and 40% distal stenosis.  There was a first marginal that had 70-80% stenosis.  The right coronary artery had 80% mid stenosis.  Left ventricular ejection fraction about 60%.  There was no gradient across the aortic valve.  REVIEW OF SYSTEMS:  CONSTITUTIONAL:  He denies fever or chills.  He has had no recent weight loss.  He has had exertional fatigue.  HEENT:  Eyes:  Negative. ENT:  Negative.   ENDOCRINE:  He denies diabetes and hypothyroidism. CARDIOVASCULAR:  He has had exertional substernal chest burning as above.  He has also had some dyspnea on exertion.  He denies PND and orthopnea.  Has had no peripheral edema.  RESPIRATORY:  He denies cough or sputum production. GASTROINTESTINAL:  He has had no nausea or vomiting.  He denies melena or bright red blood per rectum.  GENITOURINARY:  He denies dysuria and hematuria. NEUROLOGIC:  He has had no focal weakness or numbness.  He denies dizziness and syncope.  ALLERGIES:  None.  PSYCHIATRIC:  Negative.  MUSCULOSKELETAL:  He denies arthralgias and myalgias.  HEMATOLOGIC:  He denies any history of bleeding disorders or history of easy bleeding.  MEDICATIONS: 1. Imdur 60 mg q.d. 2. Bisoprolol 2.5 mg q.d. 3. Aspirin 81 mg q.d. 4. Lipitor 10 mg q.d. 5. Atacand/HCTZ 16/12.5 mg q.d.  PAST MEDICAL HISTORY:  Significant for coronary artery disease as mentioned above.  He has a history of hyperlipidemia and hypertension.  He has a history of skin cancer.  He has had cataracts removed.  He has had two back surgeries. There is a history of congestive heart failure felt secondary to diastolic dysfunction.  FAMILY HISTORY:  Positive for heart disease with his mother dying at age 73 of an MI and father dying at age 8 with a myocardial infarction.  SOCIAL HISTORY:  He is a retired Visual merchandiser, but stays very active.  He quit smoking many years ago and does not drink alcohol.  He has been married for almost 50 years and has six children.  PHYSICAL EXAMINATION  VITAL SIGNS:  Blood pressure 155/60, pulse 50 and regular, respiratory rate 16 and unlabored.  GENERAL:  He is a well-developed Kulakowski male in no distress.  HEENT:  Normocephalic, atraumatic.  Pupils are equal, round, and reactive to light and accommodation.  Extraocular muscles are intact.  Throat is clear.  NECK:  Normal carotid pulses.  There are no bruits.  There is no adenopathy  or thyromegaly.  CARDIAC:  Regular rate and rhythm with normal S1 and S2.  There is no murmur, rub, or gallop.  LUNGS:  Clear.  ABDOMEN:  Active bowel sounds.  Soft, flat, and nontender.  There are no palpable masses and no hepatosplenomegaly.  EXTREMITIES:  No peripheral edema.  Pedal pulses are palpable bilaterally.  NEUROLOGIC:  Alert and oriented x3.  Motor and sensory examinations are grossly normal.  SKIN:  Warm and dry.  IMPRESSION:  This patient has severe three vessel coronary artery disease and high grade proximal left anterior descending restenosis within the stent.  I agree that coronary artery bypass graft surgery is the best treatment for this patient.  I discussed the operative procedure with the patient and his son including alternatives, benefits, and risks including bleeding, possible blood transfusion, infection, stroke, myocardial infarction, and death.  They understand and agree to proceed.  We will obtain preoperative carotid Dopplers and plan to do surgery this Friday. Dictated by:   Alleen Borne, M.D. Attending Physician:  Colon Branch DD:  10/05/01 TD:  10/06/01 Job: [id number]

## 2010-12-13 NOTE — Cardiovascular Report (Signed)
[name], [name]                 ACCOUNT NO.:  [id number]   MEDICAL RECORD NO.:  [id number]          PATIENT TYPE:  INP   LOCATION:  6527                         FACILITY:  [facility]   PHYSICIAN:  Bruce R. Juanda Chance, MD,FACCDATE OF BIRTH:  [birth year]   DATE OF PROCEDURE:  DATE OF DISCHARGE:                              CARDIAC CATHETERIZATION   PRIMARY CARE PHYSICIAN:  Dr. Joette Catching.   CLINICAL HISTORY:  [name] is 77 years old and had bypass surgery in 2003  by Dr. Laneta Simmers.  Over the past week, he developed symptoms of shortness of  breath with exertion.  He presented to the emergency room and was admitted  to the hospital for further evaluation of his dyspnea.  It was thought that  his symptoms might be representative of an acute coronary syndrome, and he  was scheduled for evaluation angiography.   PROCEDURE:  The procedure was performed by the right femoral artery using  arterial sheath and 6-French preformed coronary catheters.  A femoral  arterial puncture was performed and Omnipaque contrast was used.  A LIMA  catheter used for injection of the LIMA graft.  Distal aortogram was  performed to rule out renovascular causes for hypertension.  The patient  tolerated the procedure well and left the laboratory in satisfactory  condition.   RESULTS:  LEFT MAIN CORONARY ARTERY:  The left main coronary artery is free  of significant disease.   LEFT ANTERIOR DESCENDING ARTERY:  The left anterior descending artery gave  rise to 3 diagonal branches and 2 septal perforators.  There was 70% lesion  in the proximal LAD.  There was 70% narrowing in the second diagonal branch.  There was competing flow in the distal LAD and second diagonal branch.   CIRCUMFLEX ARTERY:  The circumflex artery was a moderately large vessel and  gave rise to a first marginal branch, a second marginal branch, and a  posterolateral branch.  There was competing flow in the first marginal  branch and the  posterolateral branch.  There was 50% narrowing in the  proximal circumflex artery and tandem 70% stenosis in the mid artery.  This  is 50% narrowing in the second marginal branch.   RIGHT SIDE CORONARY:  The right coronary artery had a long 90% lesion in its  midportion.  The right coronary artery gave rise to 2 right ventricular  branches, a posterior descending branch, and posterolateral branch.   The saphenous vein graft to the posterior descending branch of the right  coronary was patent and functioned normally.   The sequential saphenous vein graft to the marginal and posterolateral  branch of circumflex artery was patent and functioned normally.   The vein graft to the diagonal branch of the LAD was patent and functioned  normally.   The LIMA graft to LAD was patent and functioned normally.   LEFT VENTRICULOGRAM:  Left ventriculogram performed RAO projection showed  good wall motion with no areas of hypokinesis.  The estimated ejection  fraction was 60%.   DISTAL AORTOGRAM:  A distal aortogram was performed, which showed patent  renal arteries with no obstruction and there was no aneurysm.   HEMODYNAMIC DATA:  The right atrial pressure was 8 mean.  The right  ventricular pressure 44/8.  The pulmonary artery pressure was 44/14 with a  mean of 28.  Pulmonary wedge pressure was 14 mean.  The left ventricle  pressure was 182/29.  The aortic pressure was 182/57 with a mean of 96.   The cardiac output/cardiac index by Fick was 7.2/3.9 liters/minutes/meters  squared.   CONCLUSION:  1. Coronary artery, status post coronary bypass graft surgery 2003.  2. Significant native vessel disease with 70% stenosis in the proximal LAD      and 70% stenosis in the second diagonal branch, 50% proximal and 70%      mid stenosis in the circumflex artery with 50% stenosis in the second      marginal branch, and 90% stenosis in the mid-right coronary.  3. Patent vein graft to the posterior  descending branch of the right      coronary, patent sequential vein graft to the marginal and      posterolateral branch of circumflex artery, patent vein graft to the      diagonal branch of the LAD, and patent LIMA graft to the LAD.  4. Normal LV function with an estimated fraction of 60%.  5. Mild elevation of the pulmonary artery pressures.   RECOMMENDATIONS:  The patient has normal grafts and normal LV function.  Pulmonary artery pressures are slightly elevated.  The etiology of his  dyspnea is not clear.  We will plan to get a D-dimer and probably a CT scan  to rule out pulmonary embolism.  I will discuss these findings with Dr.  Diona Browner.           ______________________________  Everardo Beals. Juanda Chance, MD,FACC     BRB/MEDQ  D:  03/13/2006  T:  03/13/2006  Job:  [id number]   cc:   Delaney Meigs, M.D.  Jonelle Sidle, MD

## 2010-12-13 NOTE — Op Note (Signed)
NAMERUTILIO, YELLOWHAIR NO.:  [id number]   MEDICAL RECORD NO.:  [id number]          PATIENT TYPE:  INP   LOCATION:  2101                         FACILITY:  [facility]   PHYSICIAN:  Shan Levans, M.D. LHCDATE OF BIRTH:  04/27/1922   DATE OF PROCEDURE:  DATE OF DISCHARGE:                                 OPERATIVE REPORT   PROCEDURE:  Bronchoscopy.   INDICATIONS:  Evaluate mucus plugging, evaluate for pneumonia, right lower  lobe.   OPERATOR:  Shan Levans, M.D.   ANESTHESIA:  1% Xylocaine local.   PREMEDICATION:  None.   PROCEDURE:  The bedside Olympus bronchoscope was inserted through the  tracheotomy tube.  The airways were visualized, showed mild  tracheobronchitis but no mucus plugging seen.  Bronchial washings were  obtained from the right lower lobe.  No endobronchial lesions were  identified.   COMPLICATIONS:  None.   IMPRESSION:  No evidence of mucus plugging or significant mucus seen in the  lungs.   RECOMMENDATIONS:  Follow up microbiologic data.      Patr   PW/MEDQ  D:  05/07/2004  T:  05/07/2004  Job:  [id number]   cc:   Jimmye Norman III, M.D.  [address]  [place]  [place] [address]  Fax: [phone]

## 2010-12-13 NOTE — Op Note (Signed)
[name], DISSINGER NO.:  [id number]   MEDICAL RECORD NO.:  [id number]          PATIENT TYPE:  INP   LOCATION:  3311                         FACILITY:  [facility]   PHYSICIAN:  Jimmye Norman III, M.D.  DATE OF BIRTH:  February 13, 1922   DATE OF PROCEDURE:  04/17/2004  DATE OF DISCHARGE:                                 OPERATIVE REPORT   PREOPERATIVE DIAGNOSES:  Peritonitis with likely small bowel perforation  from blunt trauma.   POSTOPERATIVE DIAGNOSES:  Blunt midjejunal perforation.   OPERATION PERFORMED:  1.  Exploratory laparotomy.  2.  Repair of small bowel perforation or enterorrhaphy.  3.  Evacuation and drainage of right lower quadrant abdominal wall hematoma.   SURGEON:  Marta Lamas. Lindie Spruce, M.D.   ASSISTANT:  Phineas Semen, P.A.   ANESTHESIA:  General endotracheal.   ESTIMATED BLOOD LOSS:  Less than 50 mL.   COMPLICATIONS:  None.   CONDITION:  Stable.   FINDINGS:  Diffuse small bowel peritonitis with a midjejunal perforation  about 2 cm in size.  There was diffuse contamination of enteric contents.  The liver, spleen and large bowel all appeared to be within normal limits.   DESCRIPTION OF PROCEDURE:  The patient was taken to the operating room and  placed on the table in the supine position.  After an adequate endotracheal  anesthetic was administered, the patient was prepped and draped in the usual  sterile manner exposing the midline of the abdomen. Midline incision was  made using a #10 blade, taken down two and through the midline fascia.  Once  we got to the fascia, we opened it using the hemostat clamp bluntly and  immediately, sort of cloudy, old bloody fluid came through the incision.  We  opened it for the full extent measuring total length of about 25 cm.  We  took it down mostly in the lower portion of the abdomen where we  subsequently aspirated a large amount of brown, cloudy enteric contents from  the pelvis along with some flecks of old  pieces of partially digested foot.  We noted the small bowel perforation in the midjejunum, mostly on the  antimesenteric border.  We repaired this in two layers using a mucosal layer  of running 3-0 Vicryl, then Lembert stitches on top of that. The Lembert  stitches were 3-0 silk interrupted sutures.  Once we repaired this, we ran  the small bowel from the ligament of Treitz all the way down to the terminal  ileum and found no other evidence of small bowel injury.  The mesentery  appeared to be normal.  The large bowel including the sigmoid colon which  was inflamed but did not have a perforation, was inspected.  There was no  other evidence of visceral injury.  The liver, spleen and gallbladder all  appeared to be normal.  We irrigated after we repaired with copious amounts  of warm saline solution and then we closed the abdomen using running #1 PDS2  suture.  The skin was only intermittently closed and then packed with saline  soaked 4  x 4 gauze.   We changed the surgeon's gloves prior to closure of the abdomen and then we  went ahead and opened this right lower quadrant almost at the anterior  superior iliac spine abdominal wall hematoma using a #10 blade.  We  evacuated about 200 mL of old clotted blood from this area, irrigated with  saline and placed a 10 flat Blake drain into the cavity.  We reapproximated  the skin using stainless steel staples, placed a sterile dressing there.  We  secured the drain in place with a 3-0 nylon.  All sponge, needle and  instrument counts were correct there.  The patient was then transferred to  the recovery room on the ventilator in serious condition.       JW/MEDQ  D:  04/17/2004  T:  04/18/2004  Job:  [id number]

## 2010-12-13 NOTE — H&P (Signed)
[place]. [facility]  Patient:    [name], [name]                       MRN: [id number] Adm. Date:  01/22/00 Attending:  Ronaldo Miyamoto L. Franky Macho, M.D.                         History and Physical  ADMISSION DIAGNOSES: 1. Lumbar stenosis and lumbar spondylosis with myelopathy. 2. Neurogenic claudication.  INDICATIONS:  [name] is a 77 year old retired gentleman who presented to my office Dec 04, 1999, for evaluation of bilateral lower extremity pain.  In 1979, he underwent a two-level laminectomy and diskectomy done by Dr. Patterson Hammersmith.  He did have pain in both legs and was doing well until five weeks ago when he got up while at church and started having severe pain in both legs.  He denied back pain.  The pain is in his legs, and it is bilateral.  It has been severe enough at times that he has almost fallen, but he has not taken any falls yet.  He has some numbness and tingling in the legs which is not something that he had previously.  PAST MEDICAL HISTORY:  He has a history of hypertension.  No other surgery other than for his disk.  ALLERGIES:  No known drug allergies.  SOCIAL HISTORY:  He does no smoke, drink, or use illicit drugs.  He is 5 feet 6 inches tall and weighs 174 pounds.  REVIEW OF SYSTEMS:  Positive for eye glasses, hypertension, constipation since taking hydrocodone, leg weakness, back pain, leg pain, joint pain, arthritis. He denies constitutional, ear, nose, throat, mouth, respiratory, genitourinary, skin, neurological, psychiatric, endocrine, hematologic, and allergic problems.  Pulse 88.  MEDICATIONS:  Flexeril and hydrocodone.  PHYSICAL EXAMINATION:  NEUROLOGIC:  He is alert and oriented x 4 and answering all questions appropriately.  Cranial nerves II-XII are normal.  He is well kempt and in moderate distress.  He winces when getting up from a sitting Position. Difficult tandem walk but is able to do.  Steady on Romberg testing.   He can get up on his toes and heels, although he has great difficulty getting on his heels secondary to pain.  He has an antalgic gait, walks in a stooped fashion.  On manual examination, he has 5/5 strength in upper and lower extremities. Reflexes are 3+ at the knees, biceps, triceps, brachial radialis; not elicited at the right ankle, not elicited at the left ankle.  He has no clonus. Minimal Hoffmanns sign in the right hand, not elicited in the left hand.  He has normal muscle tone and bulk.  Coordination is good in the hands.  NECK:  No cervical masses or bruits appreciated.  LUNGS:  Fields clear.  HEART:  Regular rhythm and rate.  No murmurs or rubs.  Pulses good at the wrists and feet bilaterally.  LABORATORY DATA:  MRI shows significant degenerative changes throughout the lumbar spine, profound lumbar stenosis at L4-5 secondary to facet overgrowth and ligamentum hypertrophy and a right L4-5 laterally placed disk.  Clonus was normal.  He has some clumping of the nerve roots and the thecal sac in the lumbar region.  L2-3 and L3-4 looked pretty good; 5-1 is actually also pretty good.  He has some facet hypertrophy at 3-4 bilaterally.  Plain films show scoliosis and degenerative changes in the lumbar spine and a narrow disk space at  4-5 and 5-1 and some sclerotic changes at 4-5.  CT showed mainly stenosis at L4-5 and at L3-4.  Far lateral herniation at that level and facet causing compromise with the canal.  PLAN:  My plan was to do a simple laminectomy and decompression for him.  If, at the time of surgery, he is grossly unstable, then a fusion will be performed, but that is not the plan.  He is not complaining of significant back pain, and the leg pain is what brought him to my office and what has been causing him problems.  The risks of the procedure including bleeding, infection, no pain relief, worsening pain, need for fusion, lumbar instability were explained.  He  understands and wishes to proceed.  CSF leak was also discussed.    DD:  01/22/00 TD:  01/22/00 Job: [id number]

## 2010-12-13 NOTE — Cardiovascular Report (Signed)
[place]. [facility]  Patient:    [name], [name] Visit Number: [id number] MRN: [id number]          Service Type: CAT Location: 4700 [id number] [id number] Attending Physician:  Colon Branch Dictated by:   Noralyn Pick Eden Emms, M.D. [facility] Admit Date:  10/05/2001   CC:         Colon Flattery, D.O.  Jonelle Sidle, M.D. [facility]  Daisey Must, M.D. [facility]   Cardiac Catheterization  INDICATIONS:  Recurrent chest pain with positive Cardiolite study status post stenting of the LAD in September 2002 by Dr. Gerri Spore.  RESULTS: 1. The left main coronary artery has a 30% discrete stenosis.  2. The left anterior descending artery had a very tight 95% stenosis just at    the proximal edge of the previous stent.  Unfortunately, this stenosis did    abut the left main.  There was a 30% mid vessel lesion.  3. The first diagonal branch had 70% multiple discrete lesions.  4. The circumflex coronary artery was large and codominant.  There was a 60%    proximal lesion, a 40% distal aneurysmal lesion.  The first obtuse marginal    branch had 30% multiple discrete lesions proximally.  There is a small    inferior branch of the obtuse marginal with 70-80% disease.  5. The right coronary artery was codominant.  There was an 80% mid vessel    lesion and 30-40% multiple discrete lesions distally.  RAO ventriculography:  RAO ventriculography was normal.  Ejection fraction was in the 60% range.  There was no gradient across the aortic valve and no MR. LV pressure was 136/16.  Aortic pressure was 126/15.  IMPRESSION:  The patient has had early stent restenosis and the stenosis now is much closer to the left main.  He has three-vessel disease and I think that given his age and feasibility of CABG that this would be his best option.  He will be admitted and placed on heparin and CVTS will consult on the patient. Dictated by:   Noralyn Pick Eden Emms, M.D. [facility] Attending Physician:  Colon Branch DD:  10/05/01 TD:  10/05/01 Job: [id number]

## 2010-12-13 NOTE — Discharge Summary (Signed)
[name], [name]                 ACCOUNT NO.:  [id number]   MEDICAL RECORD NO.:  [id number]          PATIENT TYPE:  INP   LOCATION:  6527                         FACILITY:  [facility]   PHYSICIAN:  Bruce R. Juanda Chance, MD,FACCDATE OF BIRTH:  [birth year]   DATE OF ADMISSION:  03/12/2006  DATE OF DISCHARGE:  03/13/2006                                 DISCHARGE SUMMARY   PRIMARY CARDIOLOGIST:  Dr. Simona Huh in [place].   PRINCIPAL DIAGNOSIS:  Dyspnea on exertion.   SECONDARY DIAGNOSES:  1. Coronary artery disease status post coronary artery bypass grafting x4      in March 2003.  2. Hypertension.  3. Hyperlipidemia.  4. Osteoarthritis.  5. Status post right knee arthroplasty.   ALLERGIES:  No known drug allergies.   PROCEDURES:  Left heart cardiac catheterization.   HISTORY OF PRESENT ILLNESS:  An 77 year old [place] male with a prior history  of CAD status post CABG in 2003 who presented to the [facility] ED with a  several-week history of dyspnea on exertion as well as complaints of  lightheadedness with standing. EKG showed no acute changes and cardiac  markers were normal in the ED.  He did have an elevated D-dimer of 0.91 and  underwent a CT of the chest which was negative for pulmonary embolism but  did show right middle lobe pulmonary parenchymal nodules with recommendation  for 25-month follow-up CT.   HOSPITAL COURSE:  Following admission, cardiac markers remained negative x3.  It was noted that his prior anginal equivalent was dyspnea and for that  reason he was scheduled for left heart cardiac catheterization.  He  underwent cardiac catheterization this morning which showed native three-  vessel disease with a patent LIMA to LAD vein graft to the diagonal,  sequential vein graft to the first obtuse marginal and __________ and patent  vein graft to the PDA.  EF was 60% with normal wall motion.  Right heart  catheterization was also performed revealing mildly elevated PA  pressure of  44/14 with a mean of 28.  His wedge was 14.  A decision has been made to  discharge him this afternoon as he has had no recurrent dyspnea.  He will  follow up Dr. Diona Browner or Dr. Ival Bible PA in approximately 2 weeks.   DISCHARGE LABS:  Hemoglobin 13.1, hematocrit 38.6, WBC 7.1, platelets 213,  MCV 95.4, sodium of 143, potassium 3.8,  chloride 110, CO2 29, BUN 15,  creatinine 0.9, glucose 105.  PT 14.8, INR 1.1, PTT 31, total bilirubin 0.8,  alkaline phosphatase 63, AST 19, ALT 19, albumin 3.5.  Cardiac markers  negative x3.  Calcium 9.0, magnesium 2.2.  Urinalysis was negative, BNP  270.0,  TSH was slightly elevated at 6.003 with a normal free T4 of 1.17, D-  dimer 0.91.   DISPOSITION:  The patient is being discharged home today in good condition.   FOLLOW-UP APPOINTMENTS:  He is to followup with Dr. Doreatha Martin McDowell's PA in  [place] on September 5 at 12:50 p.m.   DISCHARGE MEDICATIONS:  1. Aspirin 81 mg daily.  2.  Simvastatin 40 mg q.p.m.  3. Norvasc 2.5 mg daily.  4. Metoprolol tartrate 25 mg b.i.d.  5. Nitroglycerin 0.4 mg sublingual p.r.n. chest pain.   OUTSTANDING LAB STUDIES:  None.   DURATION DISCHARGE ENCOUNTER:  40 minutes including physician time.     ______________________________  Nicolasa Ducking, ANP    ______________________________  Everardo Beals. Juanda Chance, MD,FACC    CB/MEDQ  D:  03/13/2006  T:  03/13/2006  Job:  [id number]   cc:   Jonelle Sidle, MD
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
## 2010-12-13 NOTE — Op Note (Signed)
[place]. [facility]  Patient:    [name], [name]                        MRN: [id number] Proc. Date: 01/22/00 Adm. Date:  [id number] Attending:  Coletta Memos                           Operative Report  PREOPERATIVE DIAGNOSIS: Lumbar stenosis, L3 to L5.  POSTOPERATIVE DIAGNOSIS: Lumbar stenosis, L3 to L5.  OPERATION/PROCEDURE: 1. L4 lumbar laminectomy. 2. Hemilaminectomy L3 to L5 with microdissection.  ANESTHESIA: General endotracheal.  SURGEON: Kyle L. Cabbell, M.D.  ASSISTANT:  COMPLICATIONS: Dural tear with CSF leak repaired primarily.  INDICATIONS:  The patient is a 77 year old gentleman with severe lumbar stenosis causing neurogenic claudication. He did not respond to epidural steroid injections. I, therefore, recommended and he agreed to undergo lumbar decompression.  OPERATIVE NOTE:  The patient was brought to the operating room and intubated and placed under general anesthesia without difficulty. He was rolled onto the operating table assuring that all pressure points were properly padded. His back was prepped and he was draped in a sterile fashion. I used a needle for preoperative localization. This was pointing at the spinous process of L3. I used that as a marker and then infiltrated my proposed incision with 0.5% lidocaine 1:200,000 strength epinephrine. The patient was then draped in the sterile fashion. I opened the incision with a #10 blade and took this down to the thoracolumbar fascia sharply. I controlled bleeding in the subcutaneous tissue with bipolar and monopolar cautery. I then reflected the paraspinous musculature to expose the lamina of L4, L3, and L5. I took another x-ray showing that the double ended ganglia knife was inferior to the lamina of L3. After completing exposure of the lamina in a bilateral fascia, I placed a self-retaining retractor. I then proceeded with a total laminectomy of L4 using a Leksell rongeur and a  Midas Rex drill. There was significant bony overgrowth of the spinal canal by both facet and lamina. There was a long dissection, but I did expose the thecal sac. While taking off bone in a wassail direction underneath L3, I opened the dura inadvertently. The CSF did emanate from the opening. I therefore, completed my decompression protecting the dural opening with a cottonoid pad. I then closed the dura with the aid of the microscope which was also used to insect in and around the dura with three single interrupted 5-0 Prolene sutures. I completed by decompression and inspected the neural foramen. There was still some tightness there, but I did not feel that I would gain more by doing further dissection as he had a very scarred dura and he was obviously decompressed from where he had been. I then placed the seal in the wound. I closed the wound in layered fashion using the Vicryl suture and then the subcutaneous sutures. Skin reapproximated with running 3-0 nylon suture. Sterile dressing applied. The patient was extubated, moving all extremities. DD:  01/22/00 TD:  01/23/00 Job: [id number]

## 2010-12-13 NOTE — Discharge Summary (Signed)
[place]. [facility]  Patient:    [name], [name] Visit Number: [id number] MRN: [id number]          Service Type: MED Location: 2000 2033 01 Attending Physician:  Cleatrice Burke Dictated by:   Adair Patter, P.A. Admit Date:  10/05/2001 Disc. Date: 10/12/01   CC:         CVTS office   Discharge Summary  ADMITTING DIAGNOSIS:  Coronary artery disease.  SECONDARY DIAGNOSES:  Postoperative anemia secondary to blood loss.  DISCHARGE DIAGNOSES:  Coronary artery disease.  HOSPITAL PROCEDURES: 1. Cardiac catheterization. 2. Bilateral carotid duplex. 3. Pre-CABG Dopplers. 4. Coronary artery bypass graft x5.  HOSPITAL COURSE:  [name] is a patient who was admitted to [facility] on October 05, 2001, after having recurrent chest pain and a positive Cardiolite study as an outpatient.  Because of his positive Cardiolite study, he was admitted to the hospital at which time he underwent a cardiac catheterization.  This revealed he had significant coronary artery disease which was amenable to surgical correction.  Because of this, Dr. Laneta Simmers was consulted and on October 08, 2001, he performed a coronary artery bypass graft x5 of the left internal mammary artery, anastomosis to the left anterior descending, saphenous vein graft to the diagonal arteries, sequential saphenous vein graft to obtuse marginals 1 and 2 arteries, and saphenous vein graft to the right coronary artery.  Postoperatively, the patient had relatively uneventful hospital course.  He was anemic secondary to blood loss.  This was treated with transfusion of packed red blood cells.  He also had some nausea and vomiting postoperatively, which was treated by switching the patients pain medication to Ultram.  The remainder of his postoperative course was uneventful.  His hemoglobin and hematocrit were 8.6 and 24.5 at the time of discharge.  MEDICATIONS:  At the time of discharge: 1. Ultram  50 mg 1-2 tablets q.4-6h. as needed for pain. 2. Aspirin 325 mg 1 daily. 3. Lopressor 50 mg 1/2 tablet every 12 hours. 4. Lipitor 10 mg 1 daily.  ACTIVITY:  The patient was told to avoid driving, strenuous activity, and lifting heavy objects.  He was told to walk daily and continue breathing exercises.  DIET:  Low fat, low salt.  WOUND CARE:  The patient was told take a shower and clean the incisions with soap and water.  DISPOSITION:  Home.  FOLLOWUP:  The patient was told to call his cardiologist, Dr. Eden Emms, for appointment in two weeks.  He was told he would have three week appointment with Dr. Laneta Simmers.  Also, call and verify time and date of this appointment.  He will bring a chest x-ray to Dr. Garen Grams office with him. Dictated by:   Adair Patter, P.A. Attending Physician:  Cleatrice Burke DD:  10/11/01 TD:  10/12/01 Job: [id number]
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
## 2010-12-13 NOTE — Discharge Summary (Signed)
[place]  Patient:    [name], [name]                        MRN: [id number] Adm. Date:  [id number] Disc. Date: 01/26/00 Attending:  Coletta Memos                           Discharge Summary  ADMISSION DIAGNOSIS:  Lumbar stenosis L3 to L5.  DISCHARGE DIAGNOSIS:  Lumbar stenosis L3 to L5.  PROCEDURES:  L4 laminectomy, hemilaminectomies L3 and L5.  COMPLICATIONS:  Dural tear with CSF leak.  CONDITION ON DISCHARGE:  Alive and well.  Incision is clean and dry, without signs of infection.  No evidence of spinal fluid leak.  No complications. Strength is excellent.  HISTORY OF PRESENT ILLNESS AND HOSPITAL COURSE:  Jalal Rauch presented with a progressive history of pain in his lower back and bilateral lower extremities. This became much worse, to the point where I felt that surgical decompression was his best chance at achieving some relief of pain.  He was, therefore, admitted, taken to the operating room on January 22, 2000, and had an uncomplicated procedure.  He did have a dural tear.  I did tear the dura during the operation, and that was repaired primarily intraoperatively without difficulty.  Postoperatively, he has had no spinal fluid emitting from the wound.  He will be discharged home with Vicodin for pain control.  He was given instructions as to no driving for about two weeks.  FOLLOW-UP:  He will have a return appointment in six days for suture removal. DD:  01/26/00 TD:  01/26/00 Job: [id number]

## 2010-12-13 NOTE — Op Note (Signed)
NAMETOD, ABRAHAMSEN NO.:  [id number]   MEDICAL RECORD NO.:  [id number]          PATIENT TYPE:  INP   LOCATION:  2899                         FACILITY:  [facility]   PHYSICIAN:  Dyke Brackett, M.D.    DATE OF BIRTH:  [birth year]   DATE OF PROCEDURE:  05/09/2005  DATE OF DISCHARGE:                                 OPERATIVE REPORT   PREOPERATIVE DIAGNOSIS:  Severe osteoarthritis with varus deformity and  flexion contracture, right knee.   POSTOPERATIVE DIAGNOSIS:  Severe osteoarthritis with varus deformity and  flexion contracture, right knee.   OPERATION PERFORMED:  Right total knee replacement (LCS cemented DePuy knee  with Standard Plus femur patella, size 4 tibia with 10-millimeter bearing).   SURGEON:  Dyke Brackett, M.D.   ANESTHESIA:  General.   TOURNIQUET TIME:  The tourniquet time was approximately 1 hours 7 minutes.   DESCRIPTION OF OPERATION:  The patient was sterilely prepped and draped with  exsanguination of the leg with the tourniquet inflated to 350 mmHg.  A  straight skin incision using a median parapatellar approach in the knee was  made.  Moderate varus deformity was noted with wear in the medial  compartment more than the lateral, and a mild flexion contracture.   The tibia was cut with a 7-degree posterior slope, about 2 mm below the most  diseased medial compartment with an external alignment guide.  This was  followed by an anterior posterior femoral cut with __________  initially  measured at 12, then eventually 10 mm.  Distal femoral cut and a four-degree  valgus cut was with flexion extension gap equaling up to 10 mm.  Chamfer  guide was next used and then attention was next directed to the tibia.  The  posterior cruciate ligament was released and the menisci were removed.  Kiel  hole was cut for the tibia with the tibial cutting jig after sizing the  tibia to be a #4 tibia.   Given the patient's age excellent bone quality was present.   Trials were  placed in the femur and tibia with a 10-mm  bearing followed by cutting,  leaving about 14-15 mm and __________  patella with a three-hole peg.  Excellent stability was noted.  Mild release was carried out on the medial  size due to some mild medial tightening, but excellent balance of the  ligaments were obtained in flexion extension with no varus or valgus  instability, and no tendency to bearing  __________  spinout.  There was  full extension despite recurvatum with no instability at 90 degrees of  flexion.   The trial components were removed  followed by irrigation of the bony  surfaces.  Cement was inserted with two packs of cement, each with 750 mg of  cefuroxime for antibiotic-impregnated cement.  The cement was allowed to  harden.  Excess cement was removed prior to this.  The tourniquet was  released after the cement had hardened.  Small bleeders were coagulated.  No excessive bleeding was noted.  A Hemovac  drain was exiting superolaterally.  Closure of the capsule was with #1  Ethibond, 2-0 Vicryl and skin clips.  Sterile compressive dressing was  applied.   The patient was taken to the recovery room in stable condition.      Dyke Brackett, M.D.  Electronically Signed     WDC/MEDQ  D:  05/09/2005  T:  05/10/2005  Job:  [id number]
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
## 2011-01-08 ENCOUNTER — Ambulatory Visit (INDEPENDENT_AMBULATORY_CARE_PROVIDER_SITE_OTHER): Payer: PRIVATE HEALTH INSURANCE | Admitting: Cardiology

## 2011-01-08 ENCOUNTER — Encounter: Payer: Self-pay | Admitting: Cardiology

## 2011-01-08 DIAGNOSIS — M1710 Unilateral primary osteoarthritis, unspecified knee: Secondary | ICD-10-CM

## 2011-01-08 DIAGNOSIS — R55 Syncope and collapse: Secondary | ICD-10-CM

## 2011-01-08 DIAGNOSIS — I2510 Atherosclerotic heart disease of native coronary artery without angina pectoris: Secondary | ICD-10-CM

## 2011-01-08 DIAGNOSIS — M179 Osteoarthritis of knee, unspecified: Secondary | ICD-10-CM | POA: Insufficient documentation

## 2011-01-08 DIAGNOSIS — E785 Hyperlipidemia, unspecified: Secondary | ICD-10-CM

## 2011-01-08 DIAGNOSIS — I350 Nonrheumatic aortic (valve) stenosis: Secondary | ICD-10-CM

## 2011-01-08 DIAGNOSIS — I359 Nonrheumatic aortic valve disorder, unspecified: Secondary | ICD-10-CM

## 2011-01-08 DIAGNOSIS — I10 Essential (primary) hypertension: Secondary | ICD-10-CM | POA: Insufficient documentation

## 2011-01-08 NOTE — Assessment & Plan Note (Addendum)
Blood pressure control is marginal.  Patient will monitor blood pressure at home and call for multiple systolics in excess of 140.  Dr. Lysbeth Galas will arrange for appropriate laboratory testing as necessary.  I will plan to see this extraordinary gentlemen again in one year.
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
## 2011-01-08 NOTE — Assessment & Plan Note (Signed)
Patient is not orthostatic at this visit, but certainly could have been at that time he lost consciousness.  Echocardiography and exam adequately indicated the absence of important aortic stenosis.  There is no suggestion that coronary artery disease could have accounted for his loss of consciousness.  Stress testing would not likely assist in our diagnostic efforts and will be deferred.

## 2011-01-08 NOTE — Patient Instructions (Signed)
Your physician recommends that you schedule a follow-up appointment in: 1 year Your physician has requested that you regularly monitor and record your blood pressure readings at home. Please use the same machine at the same time of day to check your readings and record them to bring to your follow-up visit. CALL FOR MORE THAN A FEW SYSTOLIC READINGS >140 OR DIASTOLIC READINGS >90
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
## 2011-05-08 LAB — BASIC METABOLIC PANEL
GFR calc non Af Amer: >60
Potassium: 4.2
Sodium: 141

## 2011-05-08 LAB — HEMOGLOBIN AND HEMATOCRIT, BLOOD
HCT: 43.3
Hemoglobin: 15

## 2011-08-18 ENCOUNTER — Inpatient Hospital Stay (HOSPITAL_COMMUNITY)
Admission: EM | Admit: 2011-08-18 | Discharge: 2011-08-19 | DRG: 312 | Disposition: A | Discharge status: Home / Self Care | Payer: PRIVATE HEALTH INSURANCE | Source: Ambulatory Visit | Attending: Internal Medicine | Admitting: Internal Medicine

## 2011-08-18 ENCOUNTER — Emergency Department (HOSPITAL_COMMUNITY): Payer: PRIVATE HEALTH INSURANCE

## 2011-08-18 ENCOUNTER — Encounter (HOSPITAL_COMMUNITY): Payer: Self-pay | Admitting: Emergency Medicine

## 2011-08-18 ENCOUNTER — Other Ambulatory Visit: Payer: Self-pay

## 2011-08-18 DIAGNOSIS — I2510 Atherosclerotic heart disease of native coronary artery without angina pectoris: Secondary | ICD-10-CM

## 2011-08-18 DIAGNOSIS — I951 Orthostatic hypotension: Principal | ICD-10-CM | POA: Diagnosis present

## 2011-08-18 DIAGNOSIS — Z9861 Coronary angioplasty status: Secondary | ICD-10-CM

## 2011-08-18 DIAGNOSIS — M1710 Unilateral primary osteoarthritis, unspecified knee: Secondary | ICD-10-CM

## 2011-08-18 DIAGNOSIS — D696 Thrombocytopenia, unspecified: Secondary | ICD-10-CM | POA: Diagnosis present

## 2011-08-18 DIAGNOSIS — R55 Syncope and collapse: Secondary | ICD-10-CM

## 2011-08-18 DIAGNOSIS — E785 Hyperlipidemia, unspecified: Secondary | ICD-10-CM | POA: Diagnosis present

## 2011-08-18 DIAGNOSIS — Z951 Presence of aortocoronary bypass graft: Secondary | ICD-10-CM

## 2011-08-18 DIAGNOSIS — Z96659 Presence of unspecified artificial knee joint: Secondary | ICD-10-CM

## 2011-08-18 DIAGNOSIS — Z87891 Personal history of nicotine dependence: Secondary | ICD-10-CM

## 2011-08-18 DIAGNOSIS — I350 Nonrheumatic aortic (valve) stenosis: Secondary | ICD-10-CM

## 2011-08-18 DIAGNOSIS — I10 Essential (primary) hypertension: Secondary | ICD-10-CM | POA: Diagnosis present

## 2011-08-18 HISTORY — DX: Malignant (primary) neoplasm, unspecified: C80.1

## 2011-08-18 HISTORY — DX: Gastro-esophageal reflux disease without esophagitis: K21.9

## 2011-08-18 LAB — BASIC METABOLIC PANEL
BUN: 25 mg/dL — ABNORMAL HIGH (ref 6–23)
Creatinine, Ser: 1.31 mg/dL (ref 0.50–1.35)
GFR calc Af Amer: 55 mL/min — ABNORMAL LOW (ref >90)
GFR calc non Af Amer: 47 mL/min — ABNORMAL LOW (ref >90)
Glucose, Bld: 132 mg/dL — ABNORMAL HIGH (ref 70–99)

## 2011-08-18 LAB — CBC
HCT: 41.3 % (ref 39.0–52.0)
MCHC: 35.1 g/dL (ref 30.0–36.0)
MCV: 93.4 fL (ref 78.0–100.0)
RDW: 13.4 % (ref 11.5–15.5)

## 2011-08-18 MED ORDER — ENOXAPARIN SODIUM 40 MG/0.4ML ~~LOC~~ SOLN
40.0000 mg | Freq: Every day | SUBCUTANEOUS | Status: DC
  Administered 2011-08-18: 40 mg via SUBCUTANEOUS
  Filled 2011-08-18 (×2): qty 0.4

## 2011-08-18 MED ORDER — HYDROCHLOROTHIAZIDE 25 MG PO TABS
25.0000 mg | ORAL_TABLET | Freq: Every day | ORAL | Status: DC
  Administered 2011-08-19: 25 mg via ORAL
  Filled 2011-08-18: qty 1

## 2011-08-18 MED ORDER — SIMVASTATIN 40 MG PO TABS
40.0000 mg | ORAL_TABLET | Freq: Every day | ORAL | Status: DC
  Administered 2011-08-18: 40 mg via ORAL
  Filled 2011-08-18 (×2): qty 1

## 2011-08-18 MED ORDER — OLMESARTAN MEDOXOMIL 40 MG PO TABS
40.0000 mg | ORAL_TABLET | Freq: Every day | ORAL | Status: DC
  Administered 2011-08-18 – 2011-08-19 (×2): 40 mg via ORAL
  Filled 2011-08-18 (×2): qty 1

## 2011-08-18 MED ORDER — POTASSIUM CHLORIDE CRYS ER 20 MEQ PO TBCR
40.0000 meq | EXTENDED_RELEASE_TABLET | Freq: Once | ORAL | Status: AC
  Administered 2011-08-18: 40 meq via ORAL
  Filled 2011-08-18: qty 2

## 2011-08-18 MED ORDER — PANTOPRAZOLE SODIUM 40 MG PO TBEC
40.0000 mg | DELAYED_RELEASE_TABLET | Freq: Every day | ORAL | Status: DC
  Administered 2011-08-19: 40 mg via ORAL
  Filled 2011-08-18: qty 1

## 2011-08-18 MED ORDER — AMLODIPINE BESYLATE 5 MG PO TABS
5.0000 mg | ORAL_TABLET | Freq: Every day | ORAL | Status: DC
  Administered 2011-08-19: 5 mg via ORAL
  Filled 2011-08-18: qty 1

## 2011-08-18 MED ORDER — ASPIRIN EC 81 MG PO TBEC
81.0000 mg | DELAYED_RELEASE_TABLET | Freq: Every day | ORAL | Status: DC
  Administered 2011-08-19: 81 mg via ORAL
  Filled 2011-08-18: qty 1

## 2011-08-18 MED ORDER — VALSARTAN-HYDROCHLOROTHIAZIDE 320-25 MG PO TABS: 1.0000 | ORAL_TABLET | Freq: Every day | ORAL | Status: DC

## 2011-08-18 MED ORDER — ACETAMINOPHEN 325 MG PO TABS: 650.0000 mg | ORAL_TABLET | Freq: Four times a day (QID) | ORAL | Status: DC | PRN

## 2011-08-18 MED ORDER — DONEPEZIL HCL 10 MG PO TABS
10.0000 mg | ORAL_TABLET | Freq: Every day | ORAL | Status: DC
  Administered 2011-08-18: 10 mg via ORAL
  Filled 2011-08-18 (×2): qty 1
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
## 2011-08-18 NOTE — ED Notes (Signed)
Pt was out hunting this am took his meds without any breakfast ems reports, after hunting pt was at a convenience store and had a near syncopal episode. ems state pt bp initially was 70/40
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
## 2011-08-18 NOTE — ED Provider Notes (Signed)
I saw and evaluated the patient, reviewed the resident's note and I agree with the findings and plan.  Issues seen examined. This EKG reviewed. He'll be admitted for evaluation of syncope  Toy Baker, MD 08/18/11 1625

## 2011-08-19 ENCOUNTER — Encounter (HOSPITAL_COMMUNITY): Payer: Self-pay | Admitting: General Practice

## 2011-08-19 ENCOUNTER — Other Ambulatory Visit: Payer: Self-pay

## 2011-08-19 DIAGNOSIS — I359 Nonrheumatic aortic valve disorder, unspecified: Secondary | ICD-10-CM

## 2011-08-19 DIAGNOSIS — R55 Syncope and collapse: Secondary | ICD-10-CM

## 2011-08-19 LAB — CARDIAC PANEL(CRET KIN+CKTOT+MB+TROPI)
Relative Index: 2.7 — ABNORMAL HIGH (ref 0.0–2.5)
Relative Index: 2.3 (ref 0.0–2.5)
Troponin I: <0.3 ng/mL (ref <0.30)

## 2011-08-19 LAB — BASIC METABOLIC PANEL
GFR calc Af Amer: 72 mL/min — ABNORMAL LOW (ref >90)
GFR calc non Af Amer: 62 mL/min — ABNORMAL LOW (ref >90)
Glucose, Bld: 138 mg/dL — ABNORMAL HIGH (ref 70–99)
Potassium: 4.3 mEq/L (ref 3.5–5.1)
Sodium: 141 mEq/L (ref 135–145)

## 2011-08-19 LAB — CBC
Hemoglobin: 14.5 g/dL (ref 13.0–17.0)
MCHC: 35.5 g/dL (ref 30.0–36.0)
RDW: 13.5 % (ref 11.5–15.5)
WBC: 10.7 10*3/uL — ABNORMAL HIGH (ref 4.0–10.5)

## 2011-08-19 MED ORDER — ATORVASTATIN CALCIUM 20 MG PO TABS: 20.0000 mg | ORAL_TABLET | Freq: Every day | ORAL | Status: DC

## 2011-08-19 MED ORDER — ATORVASTATIN CALCIUM 10 MG PO TABS
20.0000 mg | ORAL_TABLET | Freq: Every day | ORAL | Status: DC
  Filled 2011-08-19: qty 2

## 2011-08-19 NOTE — Progress Notes (Signed)
Preliminary results of carotids called and made aware to MD, was told ok to d/c home.
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
## 2011-08-19 NOTE — Progress Notes (Signed)
Patient ID: [name], male   DOB: 1921/08/31, 78 y.o.   MRN: [id number] Mentioned by the admitting physician that the patient was seen in the emergency room with recommendations from cardiology. It is my understanding that a formal consult was not done. The plan was to follow the patient in the hospital to be sure he was stable and obtain an echo. I have read the echo. There is aortic stenosis there is mild to moderate. There is some mitral stenosis. There is also a vigorous left ventricular function with a small intracavitary gradient related to the vigorous function. None of these findings need further workup at this time. It was mentioned that a Holter monitor could be arranged for the patient. If the patient has no signs of CHF, it will be optimal to be sure that his volume status is maintained at an adequate level.

## 2011-08-19 NOTE — ED Provider Notes (Signed)
I saw and evaluated the patient, reviewed the resident's note and I agree with the findings and plan.   Jerome Baker, MD 08/19/11 [phone]

## 2011-08-19 NOTE — Progress Notes (Signed)
D/c orders received; IV removed with gauze on; pt remains in stable condition; pt meds and instructions reviewed and given to pt; pt d/c home

## 2011-08-19 NOTE — Discharge Summary (Signed)
Discharge Summary  SUSAN ARANA MR#: [id number]  DOB:11/07/1921  Date of Admission: 08/18/2011 Date of Discharge: 08/19/2011  Patient's PCP: Josue Hector, MD, MD  Attending Physician:HONGALGI,ANAND  Consults: None  Discharge Diagnoses: Principal Problem:  *Syncope Active Problems:  Coronary artery disease  Aortic stenosis  Hypertension  Hyperlipidemia  Degenerative joint disease of knee   Brief Admitting History and Physical In the 78-year-old male with history of coronary artery disease status post PCI and CABG, hypertension, hyperlipidemia, previous mild aortic stenosis, previous episode of presyncope in May of 2012 was admitted with another episode of syncope. Patient is physically quite active and ambulates a mile a day. On the day of admission patient went out hunting at approximately 6 AM and had yet not eaten or drunk anything. After 3 hours of hunting patient returnednd went to the grocery store and ate some peanut butter nabs. Within a short while patient felt sick in the stomach and vomited once. He indicates that soon after that he passed out and the next thing he remembers is waking up on the floor with people surrounding him. He was admitted to the hospital for further evaluation. He denies any dizziness or light headedness or chest pain or palpitations.  Discharge Medications Current Discharge Medication List    CONTINUE these medications which have CHANGED   Details  atorvastatin (LIPITOR) 20 MG tablet Take 1 tablet (20 mg total) by mouth daily. Qty: 30 tablet, Refills: 0      CONTINUE these medications which have NOT CHANGED   Details  acetaminophen (TYLENOL) 325 MG tablet Take 650 mg by mouth every 6 (six) hours as needed. For pain    amLODipine (NORVASC) 5 MG tablet Take 5 mg by mouth daily.      aspirin 81 MG tablet Take 81 mg by mouth daily.      donepezil (ARICEPT) 10 MG tablet Take 10 mg by mouth at bedtime.    omeprazole (PRILOSEC) 20 MG  capsule Take 20 mg by mouth 2 (two) times daily.     valsartan-hydrochlorothiazide (DIOVAN-HCT) 320-25 MG per tablet Take 1 tablet by mouth daily.          Hospital Course: 1. Syncope: Differential diagnoses include vasovagal syncope versus secondary to hypotension/orthostatic hypotension. Patient has no focal signs. He is asymptomatic and has ambulated in the room and with physical therapy. 2-D echocardiogram results are as indicated below. Cardiology has reviewed the echo results and do not think there is anything in that the would cause syncope. I have discussed his case with Dr. Willa Rough who will arrange for outpatient Holter monitoring and cardiology followup. Patient will also have a carotid Doppler to rule out stenosis prior to discharge. 2. Hypertension: Reasonably controlled. 3. Mild to moderate aortic stenosis, some mitral stenosis and vigorous left ventricular function with a small intracavitary gradient on 2-D echo, outpatient followup with cardiology and avoid dehydration. This has been discussed with patient and his son who was at the bedside and verbalize understanding. 4. Coronary artery disease, status post CABG: aymptomatic of any chest pain. Continue aspirin and statins. 5. Hyperlipidemia: Continue statins 6. ? Mild dehydration: Resolved 7. Thrombocytopenia and leukocytosis: Unclear etiology. No clinical focus of infection. Resolved   Day of Discharge BP 135/61  Pulse 65  Temp(Src) 98.6 F (37 C) (Oral)  Resp 18  Ht 5\' 4"  (1.626 m)  Wt 68.7 kg (151 lb 7.3 oz)  BMI 26.00 kg/m2  SpO2 95%  General exam: Comfortable.  respiratory system: Clear. No  increased work of breathing. Cardiovascular system: First and second heart sounds heard, regular. No JVD. Telemetry shows sinus rhythm. Systolic ejection murmur 2 x 6 at the apex. Gastrointestinal system: Abdomen is soft, nondistended and nontender. Normal bowel sounds heard. Central nervous system: Alert and oriented. No  focal neurological deficits.  Results for orders placed during the hospital encounter of 08/18/11 (from the past 48 hour(s))  CBC     Status: Abnormal   Collection Time   08/18/11  3:48 PM      Component Value Range Comment   WBC 14.8 (*) 4.0 - 10.5 (K/uL)    RBC 4.42  4.22 - 5.81 (MIL/uL)    Hemoglobin 14.5  13.0 - 17.0 (g/dL)    HCT 40.9  81.1 - 91.4 (%)    MCV 93.4  78.0 - 100.0 (fL)    MCH 32.8  26.0 - 34.0 (pg)    MCHC 35.1  30.0 - 36.0 (g/dL)    RDW 78.2  95.6 - 21.3 (%)    Platelets 127 (*) 150 - 400 (K/uL)   BASIC METABOLIC PANEL     Status: Abnormal   Collection Time   08/18/11  3:48 PM      Component Value Range Comment   Sodium 137  135 - 145 (mEq/L)    Potassium 3.6  3.5 - 5.1 (mEq/L)    Chloride 102  96 - 112 (mEq/L)    CO2 23  19 - 32 (mEq/L)    Glucose, Bld 132 (*) 70 - 99 (mg/dL)    BUN 25 (*) 6 - 23 (mg/dL)    Creatinine, Ser 0.86  0.50 - 1.35 (mg/dL)    Calcium 9.0  8.4 - 10.5 (mg/dL)    GFR calc non Af Amer 47 (*) >90 (mL/min)    GFR calc Af Amer 55 (*) >90 (mL/min)   TROPONIN I     Status: Normal   Collection Time   08/18/11  3:49 PM      Component Value Range Comment   Troponin I <0.30  <0.30 (ng/mL)   CARDIAC PANEL(CRET KIN+CKTOT+MB+TROPI)     Status: Abnormal   Collection Time   08/18/11 11:34 PM      Component Value Range Comment   Total CK 182  7 - 232 (U/L)    CK, MB 5.0 (*) 0.3 - 4.0 (ng/mL)    Troponin I <0.30  <0.30 (ng/mL)    Relative Index 2.7 (*) 0.0 - 2.5    CARDIAC PANEL(CRET KIN+CKTOT+MB+TROPI)     Status: Normal   Collection Time   08/19/11  8:30 AM      Component Value Range Comment   Total CK 175  7 - 232 (U/L)    CK, MB 4.0  0.3 - 4.0 (ng/mL)    Troponin I <0.30  <0.30 (ng/mL)    Relative Index 2.3  0.0 - 2.5    CBC     Status: Abnormal   Collection Time   08/19/11  8:30 AM      Component Value Range Comment   WBC 10.7 (*) 4.0 - 10.5 (K/uL)    RBC 4.40  4.22 - 5.81 (MIL/uL)    Hemoglobin 14.5  13.0 - 17.0 (g/dL)    HCT 57.8   46.9 - 62.9 (%)    MCV 93.0  78.0 - 100.0 (fL)    MCH 33.0  26.0 - 34.0 (pg)    MCHC 35.5  30.0 - 36.0 (g/dL)    RDW 52.8  41.3 -  15.5 (%)    Platelets 215  150 - 400 (K/uL) DELTA CHECK NOTED  BASIC METABOLIC PANEL     Status: Abnormal   Collection Time   08/19/11  8:30 AM      Component Value Range Comment   Sodium 141  135 - 145 (mEq/L)    Potassium 4.3  3.5 - 5.1 (mEq/L)    Chloride 105  96 - 112 (mEq/L)    CO2 29  19 - 32 (mEq/L)    Glucose, Bld 138 (*) 70 - 99 (mg/dL)    BUN 22  6 - 23 (mg/dL)    Creatinine, Ser 4.09  0.50 - 1.35 (mg/dL)    Calcium 9.3  8.4 - 10.5 (mg/dL)    GFR calc non Af Amer 62 (*) >90 (mL/min)    GFR calc Af Amer 72 (*) >90 (mL/min)    Lab data 1. Cardiac panel cycled and negative.   Dg Chest 2 View  08/18/2011  *RADIOLOGY REPORT*  Clinical Data: Syncope.  Hypertension.  Coronary artery disease and CABG.  The  CHEST - 2 VIEW  Comparison: 12/12/2010.  Findings: CABG.  Aortic arch atherosclerosis.  No airspace disease, effusion.  Mild basilar atelectasis is present associated with emphysema.  Flattening of hemidiaphragms.  IMPRESSION: Emphysema without acute cardiopulmonary disease.  CABG.  Original Report Authenticated By: Andreas Newport, M.D.   2-D echocardiogram:  Study Conclusions  - Left ventricle: There is vigorous LV function. The EF is 70%. There is a a mild itracavitry LV gradient. The cavity size was normal. Wall thickness was increased in a pattern of mild LVH. Doppler parameters are consistent with abnormal left ventricular relaxation (grade 1 diastolic dysfunction). - Aortic valve: Mean gradient: 19mm Hg (S). Peak gradient: 31mm Hg (S). - Mitral valve: There is moderate annular calcium. There is some functional mitral stenosis. Valve area by pressure half-time: 1.25cm^2. Valve area by continuity equation (using LVOT flow): 1.63cm^2. - Right atrium: The atrium was mildly dilated. - Impressions: Pt. has multiple findings that will have  to be followed over time. There is mild mitral stenosis and mild/moderate aortic stenosis. Neither of these are severe enough to cause syncope. There is also a vigorous LV with a mild intracavitary gradient. Unless there are signs of CHF, it will be important to keep the patient hydrated with rate controlled. Impressions:  - Pt. has multiple findings that will have to be followed over time. There is mild mitral stenosis and mild/moderate aortic stenosis. Neither of these are severe enough to cause syncope. There is also a vigorous LV with a mild intracavitary gradient. Unless there are signs of CHF, it will be important to keep the patient hydrated with rate controlled.     Disposition: Discharged home in stable condition  Diet: Heart healthy  Activity: Increase activity gradually  Follow-up Appts: Discharge Orders    Future Orders Please Complete By Expires   Diet - low sodium heart healthy      Increase activity slowly      Call MD for:  persistant dizziness or light-headedness      Call MD for:  extreme fatigue         TESTS THAT NEED FOLLOW-UP Outpatient Holter by cardiology  Time spent on discharge, talking to the patient, and coordinating care: 35 mins.   SignedMarcellus Scott, MD 08/19/2011, 1:04 PM

## 2011-08-19 NOTE — Progress Notes (Signed)
2D Echo completed.  Kobie Matkins Johnson, RDCS 

## 2011-08-19 NOTE — Progress Notes (Signed)
Preliminary results  Bilateral carotid duplex completed. Bilateral:  40-59% internal carotid artery stenosis.  Bilateral:  Vertebral artery flow is antegrade.      [name],RVS, [place] D. 08/19/11 1453

## 2011-08-19 NOTE — Progress Notes (Signed)
[name],PT Acute Rehabilitation [phone] (pager)  

## 2011-11-04 ENCOUNTER — Ambulatory Visit: Payer: PRIVATE HEALTH INSURANCE | Admitting: Adult Health

## 2012-01-06 ENCOUNTER — Other Ambulatory Visit (HOSPITAL_COMMUNITY): Payer: Self-pay | Admitting: Internal Medicine

## 2012-05-18 ENCOUNTER — Encounter (HOSPITAL_COMMUNITY): Payer: Self-pay | Admitting: Emergency Medicine

## 2012-05-18 ENCOUNTER — Emergency Department (HOSPITAL_COMMUNITY): Payer: PRIVATE HEALTH INSURANCE

## 2012-05-18 ENCOUNTER — Emergency Department (HOSPITAL_COMMUNITY)
Admission: EM | Admit: 2012-05-18 | Discharge: 2012-05-18 | Disposition: A | Discharge status: Home / Self Care | Payer: PRIVATE HEALTH INSURANCE | Attending: Emergency Medicine | Admitting: Emergency Medicine

## 2012-05-18 DIAGNOSIS — IMO0002 Reserved for concepts with insufficient information to code with codable children: Secondary | ICD-10-CM | POA: Insufficient documentation

## 2012-05-18 DIAGNOSIS — Z87891 Personal history of nicotine dependence: Secondary | ICD-10-CM | POA: Insufficient documentation

## 2012-05-18 DIAGNOSIS — R0602 Shortness of breath: Secondary | ICD-10-CM | POA: Insufficient documentation

## 2012-05-18 DIAGNOSIS — E785 Hyperlipidemia, unspecified: Secondary | ICD-10-CM | POA: Insufficient documentation

## 2012-05-18 DIAGNOSIS — Z79899 Other long term (current) drug therapy: Secondary | ICD-10-CM | POA: Insufficient documentation

## 2012-05-18 DIAGNOSIS — Z8589 Personal history of malignant neoplasm of other organs and systems: Secondary | ICD-10-CM | POA: Insufficient documentation

## 2012-05-18 DIAGNOSIS — F0390 Unspecified dementia without behavioral disturbance: Secondary | ICD-10-CM | POA: Insufficient documentation

## 2012-05-18 DIAGNOSIS — I2510 Atherosclerotic heart disease of native coronary artery without angina pectoris: Secondary | ICD-10-CM | POA: Insufficient documentation

## 2012-05-18 DIAGNOSIS — R55 Syncope and collapse: Secondary | ICD-10-CM | POA: Insufficient documentation

## 2012-05-18 DIAGNOSIS — K219 Gastro-esophageal reflux disease without esophagitis: Secondary | ICD-10-CM | POA: Insufficient documentation

## 2012-05-18 DIAGNOSIS — Z87828 Personal history of other (healed) physical injury and trauma: Secondary | ICD-10-CM | POA: Insufficient documentation

## 2012-05-18 DIAGNOSIS — Z7982 Long term (current) use of aspirin: Secondary | ICD-10-CM | POA: Insufficient documentation

## 2012-05-18 DIAGNOSIS — I10 Essential (primary) hypertension: Secondary | ICD-10-CM | POA: Insufficient documentation

## 2012-05-18 DIAGNOSIS — M1710 Unilateral primary osteoarthritis, unspecified knee: Secondary | ICD-10-CM | POA: Insufficient documentation

## 2012-05-18 LAB — COMPREHENSIVE METABOLIC PANEL
Alkaline Phosphatase: 92 U/L (ref 39–117)
BUN: 25 mg/dL — ABNORMAL HIGH (ref 6–23)
Chloride: 99 mEq/L (ref 96–112)
GFR calc Af Amer: 54 mL/min — ABNORMAL LOW (ref >90)
Glucose, Bld: 125 mg/dL — ABNORMAL HIGH (ref 70–99)
Potassium: 4 mEq/L (ref 3.5–5.1)
Total Bilirubin: 0.5 mg/dL (ref 0.3–1.2)

## 2012-05-18 LAB — CBC WITH DIFFERENTIAL/PLATELET
Hemoglobin: 14.2 g/dL (ref 13.0–17.0)
Lymphs Abs: 0.7 10*3/uL (ref 0.7–4.0)
Monocytes Relative: 6 % (ref 3–12)
Neutro Abs: 14.1 10*3/uL — ABNORMAL HIGH (ref 1.7–7.7)
Neutrophils Relative %: 89 % — ABNORMAL HIGH (ref 43–77)
RBC: 4.25 MIL/uL (ref 4.22–5.81)

## 2012-05-18 LAB — POCT I-STAT TROPONIN I: Troponin i, poc: 0 ng/mL (ref 0.00–0.08)

## 2012-05-18 NOTE — ED Notes (Signed)
Per EMS: pt from home went in after raking leaves and had near syncope while having BM; pt sts hx of similar in past and seen here; pt denies CP or SOB; pt sts some lightheadedness prior to event; pt sts remembers event; pt alert at present; pt diaphoretic upon EMS arrival but dry at present
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
## 2012-05-19 NOTE — ED Provider Notes (Signed)
Medical screening examination/treatment/procedure(s) were conducted as a shared visit with non-physician practitioner(s) and myself.  I personally evaluated the patient during the encounter  Tobin Chad, MD 05/19/12 1705

## 2013-03-31 ENCOUNTER — Encounter (HOSPITAL_COMMUNITY)
Admission: EM | Disposition: A | Discharge status: Home / Self Care | Payer: Self-pay | Source: Home / Self Care | Attending: Cardiovascular Disease

## 2013-03-31 ENCOUNTER — Encounter (HOSPITAL_COMMUNITY): Payer: Self-pay | Admitting: *Deleted

## 2013-03-31 ENCOUNTER — Ambulatory Visit (HOSPITAL_COMMUNITY): Admit: 2013-03-31 | Payer: Self-pay | Admitting: Cardiology

## 2013-03-31 ENCOUNTER — Emergency Department (HOSPITAL_COMMUNITY): Payer: PRIVATE HEALTH INSURANCE

## 2013-03-31 ENCOUNTER — Inpatient Hospital Stay (HOSPITAL_COMMUNITY)
Admission: EM | Admit: 2013-03-31 | Discharge: 2013-04-02 | DRG: 243 | Disposition: A | Discharge status: Home / Self Care | Payer: PRIVATE HEALTH INSURANCE | Attending: Cardiovascular Disease | Admitting: Cardiovascular Disease

## 2013-03-31 DIAGNOSIS — I442 Atrioventricular block, complete: Secondary | ICD-10-CM

## 2013-03-31 DIAGNOSIS — R55 Syncope and collapse: Secondary | ICD-10-CM

## 2013-03-31 DIAGNOSIS — Z8549 Personal history of malignant neoplasm of other male genital organs: Secondary | ICD-10-CM

## 2013-03-31 DIAGNOSIS — I452 Bifascicular block: Secondary | ICD-10-CM | POA: Diagnosis present

## 2013-03-31 DIAGNOSIS — F05 Delirium due to known physiological condition: Secondary | ICD-10-CM | POA: Diagnosis present

## 2013-03-31 DIAGNOSIS — I10 Essential (primary) hypertension: Secondary | ICD-10-CM | POA: Diagnosis present

## 2013-03-31 DIAGNOSIS — N289 Disorder of kidney and ureter, unspecified: Secondary | ICD-10-CM | POA: Diagnosis present

## 2013-03-31 DIAGNOSIS — F0390 Unspecified dementia without behavioral disturbance: Secondary | ICD-10-CM | POA: Diagnosis present

## 2013-03-31 DIAGNOSIS — IMO0002 Reserved for concepts with insufficient information to code with codable children: Secondary | ICD-10-CM | POA: Diagnosis present

## 2013-03-31 DIAGNOSIS — Z87891 Personal history of nicotine dependence: Secondary | ICD-10-CM

## 2013-03-31 DIAGNOSIS — Z951 Presence of aortocoronary bypass graft: Secondary | ICD-10-CM

## 2013-03-31 DIAGNOSIS — Z79899 Other long term (current) drug therapy: Secondary | ICD-10-CM

## 2013-03-31 DIAGNOSIS — I441 Atrioventricular block, second degree: Secondary | ICD-10-CM | POA: Diagnosis present

## 2013-03-31 DIAGNOSIS — E785 Hyperlipidemia, unspecified: Secondary | ICD-10-CM | POA: Diagnosis present

## 2013-03-31 DIAGNOSIS — Z9861 Coronary angioplasty status: Secondary | ICD-10-CM

## 2013-03-31 DIAGNOSIS — I359 Nonrheumatic aortic valve disorder, unspecified: Secondary | ICD-10-CM | POA: Diagnosis present

## 2013-03-31 DIAGNOSIS — Z96659 Presence of unspecified artificial knee joint: Secondary | ICD-10-CM

## 2013-03-31 DIAGNOSIS — I2510 Atherosclerotic heart disease of native coronary artery without angina pectoris: Secondary | ICD-10-CM | POA: Diagnosis present

## 2013-03-31 DIAGNOSIS — Z7982 Long term (current) use of aspirin: Secondary | ICD-10-CM

## 2013-03-31 DIAGNOSIS — K219 Gastro-esophageal reflux disease without esophagitis: Secondary | ICD-10-CM | POA: Diagnosis present

## 2013-03-31 DIAGNOSIS — M1710 Unilateral primary osteoarthritis, unspecified knee: Secondary | ICD-10-CM | POA: Diagnosis present

## 2013-03-31 HISTORY — DX: Rheumatic mitral stenosis: I05.0

## 2013-03-31 HISTORY — DX: Disorder of kidney and ureter, unspecified: N28.9

## 2013-03-31 HISTORY — DX: Atrioventricular block, complete: I44.2

## 2013-03-31 HISTORY — PX: TEMPORARY PACEMAKER INSERTION: SHX5471

## 2013-03-31 LAB — COMPREHENSIVE METABOLIC PANEL
AST: 23 U/L (ref 0–37)
Albumin: 3.5 g/dL (ref 3.5–5.2)
Alkaline Phosphatase: 67 U/L (ref 39–117)
Chloride: 108 mEq/L (ref 96–112)
Creatinine, Ser: 1.55 mg/dL — ABNORMAL HIGH (ref 0.50–1.35)
Potassium: 4.4 mEq/L (ref 3.5–5.1)
Total Bilirubin: 0.5 mg/dL (ref 0.3–1.2)
Total Protein: 6.5 g/dL (ref 6.0–8.3)

## 2013-03-31 LAB — CBC WITH DIFFERENTIAL/PLATELET
Basophils Absolute: 0.1 10*3/uL (ref 0.0–0.1)
Basophils Relative: 0 % (ref 0–1)
Eosinophils Absolute: 0.2 10*3/uL (ref 0.0–0.7)
MCH: 32.7 pg (ref 26.0–34.0)
MCHC: 33.5 g/dL (ref 30.0–36.0)
Neutro Abs: 9.3 10*3/uL — ABNORMAL HIGH (ref 1.7–7.7)
Neutrophils Relative %: 81 % — ABNORMAL HIGH (ref 43–77)
RDW: 14.1 % (ref 11.5–15.5)

## 2013-03-31 LAB — POCT I-STAT, CHEM 8
BUN: 51 mg/dL — ABNORMAL HIGH (ref 6–23)
Creatinine, Ser: 1.5 mg/dL — ABNORMAL HIGH (ref 0.50–1.35)
Glucose, Bld: 119 mg/dL — ABNORMAL HIGH (ref 70–99)
Hemoglobin: 13.6 g/dL (ref 13.0–17.0)
Potassium: 4.4 mEq/L (ref 3.5–5.1)

## 2013-03-31 LAB — POCT I-STAT TROPONIN I

## 2013-03-31 SURGERY — TEMPORARY PACEMAKER INSERTION
Anesthesia: LOCAL

## 2013-03-31 MED ORDER — FENTANYL CITRATE 0.05 MG/ML IJ SOLN
100.0000 ug | Freq: Once | INTRAMUSCULAR | Status: AC
  Administered 2013-03-31: 100 ug via INTRAVENOUS

## 2013-03-31 MED ORDER — SODIUM CHLORIDE 0.9 % IJ SOLN
3.0000 mL | Freq: Two times a day (BID) | INTRAMUSCULAR | Status: DC
  Administered 2013-03-31 – 2013-04-01 (×2): 3 mL via INTRAVENOUS

## 2013-03-31 MED ORDER — SODIUM CHLORIDE 0.9 % IV SOLN: INTRAVENOUS | Status: DC

## 2013-03-31 MED ORDER — SODIUM CHLORIDE 0.9 % IV SOLN
INTRAVENOUS | Status: DC
  Administered 2013-03-31: via INTRAVENOUS

## 2013-03-31 MED ORDER — SODIUM CHLORIDE 0.9 % IV BOLUS (SEPSIS)
1000.0000 mL | Freq: Once | INTRAVENOUS | Status: AC
  Administered 2013-03-31: 1000 mL via INTRAVENOUS

## 2013-03-31 MED ORDER — FENTANYL CITRATE 0.05 MG/ML IJ SOLN
INTRAMUSCULAR | Status: AC
  Administered 2013-03-31: 100 ug via INTRAVENOUS
  Filled 2013-03-31: qty 2

## 2013-03-31 NOTE — ED Notes (Signed)
Family at bedside. 

## 2013-03-31 NOTE — CV Procedure (Signed)
  Temporary transvenous pacemaker insertion  Indication: 79 year old [place] male with recurrent syncope. He has complete heart block with an escape rate of 30 beats per minute. He has a chronic right bundle branch block. He is currently being transcutaneously paced.  Preparation: The patient was brought to the cardiac catheterization laboratory. He was placed in a supine position. His right groin was prepped and draped in a sterile fashion. It was anesthetized with 1% lidocaine.  Procedure: A 6 French venous sheath was inserted in the right femoral vein using a modified Seldinger technique. A balloon tip transvenous pacemaker wire was advanced under fluoroscopic guidance into the right ventricular apex. The heart was paced with excellent capture down to 1 MA. The sheath and wire were secured. The patient was transferred to the coronary intensive care unit for further monitoring. No complications.  Final assessment: Successful insertion of a temporary transvenous pacemaker wire.  Peter Swaziland MD, [facility]  03/31/2013 10:37 PM
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
## 2013-03-31 NOTE — ED Provider Notes (Signed)
I saw and evaluated the patient, reviewed the resident's note and I agree with the findings including ECG interpretation and plan.  Patient with near-syncope versus syncope prior to arrival found with complete heart block and transcutaneously paced prior to arrival, upon arrival ECG check confirms complete heart block, with pacemaker turned off patient had recurrent near-syncope which resolved as soon as transcutaneous pacemaker turned on, patient awake alert with no shortness of breath lungs clear no audible cardiac murmur, good pulse with both intrinsic and paced beats with or without pacemaker.  CRITICAL CARE Performed by: Hurman Horn Total critical care time: including multiple bedside assessments and d/w cardiology in ED. Critical care time was exclusive of separately billable procedures and treating other patients. Critical care was necessary to treat or prevent imminent or life-threatening deterioration. Critical care was time spent personally by me on the following activities: development of treatment plan with patient and/or surrogate as well as nursing, discussions with consultants, evaluation of patient's response to treatment, examination of patient, obtaining history from patient or surrogate, ordering and performing treatments and interventions, ordering and review of laboratory studies, ordering and review of radiographic studies, pulse oximetry and re-evaluation of patient's condition.  Hurman Horn, MD 04/01/13 2129

## 2013-03-31 NOTE — ED Notes (Addendum)
Pt. Had x 3 syncope episodes. First syncope lasted x 1 minute while the other 2 lasted < minute.  According to son, during the first episode, pts. Eye's were fixed and diaphoretic; pt.has a hx. Of syncope but not to this extent.   Pt. Came via EMS. Critical/stable upon arrival.  VS stable. PIV intact.  External Pacing in place.

## 2013-03-31 NOTE — ED Notes (Signed)
Cardiologists at bedside

## 2013-03-31 NOTE — ED Notes (Signed)
Cardiologists accompanies primary RN to Cardiac Cath. Lab.  Report given to Dr. Swaziland and Cath team.

## 2013-03-31 NOTE — ED Notes (Signed)
MD at bedside. 

## 2013-04-01 ENCOUNTER — Encounter (HOSPITAL_COMMUNITY)
Admission: EM | Disposition: A | Discharge status: Home / Self Care | Payer: Self-pay | Source: Home / Self Care | Attending: Cardiovascular Disease

## 2013-04-01 ENCOUNTER — Encounter (HOSPITAL_COMMUNITY): Payer: Self-pay | Admitting: Anesthesiology

## 2013-04-01 DIAGNOSIS — I442 Atrioventricular block, complete: Principal | ICD-10-CM

## 2013-04-01 DIAGNOSIS — I441 Atrioventricular block, second degree: Secondary | ICD-10-CM

## 2013-04-01 DIAGNOSIS — I2510 Atherosclerotic heart disease of native coronary artery without angina pectoris: Secondary | ICD-10-CM

## 2013-04-01 DIAGNOSIS — I359 Nonrheumatic aortic valve disorder, unspecified: Secondary | ICD-10-CM

## 2013-04-01 DIAGNOSIS — F0390 Unspecified dementia without behavioral disturbance: Secondary | ICD-10-CM

## 2013-04-01 HISTORY — PX: PACEMAKER INSERTION: SHX728

## 2013-04-01 HISTORY — PX: PERMANENT PACEMAKER INSERTION: SHX5480

## 2013-04-01 LAB — BASIC METABOLIC PANEL
BUN: 42 mg/dL — ABNORMAL HIGH (ref 6–23)
Chloride: 108 mEq/L (ref 96–112)
Creatinine, Ser: 1.21 mg/dL (ref 0.50–1.35)
GFR calc Af Amer: 59 mL/min — ABNORMAL LOW (ref >90)

## 2013-04-01 LAB — CBC
HCT: 35.3 % — ABNORMAL LOW (ref 39.0–52.0)
MCH: 33.2 pg (ref 26.0–34.0)
MCHC: 34 g/dL (ref 30.0–36.0)
RDW: 14.2 % (ref 11.5–15.5)

## 2013-04-01 LAB — PROTIME-INR: INR: 1.13 (ref 0.00–1.49)

## 2013-04-01 LAB — MRSA PCR SCREENING: MRSA by PCR: NEGATIVE

## 2013-04-01 SURGERY — PERMANENT PACEMAKER INSERTION
Anesthesia: Monitor Anesthesia Care

## 2013-04-01 MED ORDER — SODIUM CHLORIDE 0.9 % IJ SOLN: 3.0000 mL | Freq: Two times a day (BID) | INTRAMUSCULAR | Status: DC

## 2013-04-01 MED ORDER — CHLORHEXIDINE GLUCONATE 4 % EX LIQD
60.0000 mL | Freq: Once | CUTANEOUS | Status: AC
  Administered 2013-04-01: 4 via TOPICAL
  Filled 2013-04-01: qty 60

## 2013-04-01 MED ORDER — HYDROCODONE-ACETAMINOPHEN 5-325 MG PO TABS: 1.0000 | ORAL_TABLET | ORAL | Status: DC | PRN

## 2013-04-01 MED ORDER — SODIUM CHLORIDE 0.9 % IV SOLN: 250.0000 mL | INTRAVENOUS | Status: DC | PRN

## 2013-04-01 MED ORDER — CEFAZOLIN SODIUM-DEXTROSE 2-3 GM-% IV SOLR
2.0000 g | INTRAVENOUS | Status: DC
  Filled 2013-04-01: qty 50

## 2013-04-01 MED ORDER — SODIUM CHLORIDE 0.9 % IJ SOLN: 3.0000 mL | INTRAMUSCULAR | Status: DC | PRN

## 2013-04-01 MED ORDER — LIDOCAINE HCL (PF) 1 % IJ SOLN
INTRAMUSCULAR | Status: AC
  Filled 2013-04-01: qty 60

## 2013-04-01 MED ORDER — ACETAMINOPHEN 325 MG PO TABS: 325.0000 mg | ORAL_TABLET | ORAL | Status: DC | PRN

## 2013-04-01 MED ORDER — SODIUM CHLORIDE 0.9 % IV SOLN
INTRAVENOUS | Status: DC
  Administered 2013-04-01: 20 mL/h via INTRAVENOUS

## 2013-04-01 MED ORDER — ONDANSETRON HCL 4 MG/2ML IJ SOLN: 4.0000 mg | Freq: Four times a day (QID) | INTRAMUSCULAR | Status: DC | PRN

## 2013-04-01 MED ORDER — SODIUM CHLORIDE 0.9 % IR SOLN
80.0000 mg | Status: DC
  Filled 2013-04-01: qty 2

## 2013-04-01 MED ORDER — CEFAZOLIN SODIUM 1-5 GM-% IV SOLN
1.0000 g | Freq: Four times a day (QID) | INTRAVENOUS | Status: AC
  Administered 2013-04-01 – 2013-04-02 (×3): 1 g via INTRAVENOUS
  Filled 2013-04-01 (×4): qty 50

## 2013-04-01 MED ORDER — CHLORHEXIDINE GLUCONATE 4 % EX LIQD: 60.0000 mL | Freq: Once | CUTANEOUS | Status: DC

## 2013-04-01 NOTE — Progress Notes (Signed)
Nutrition Brief Note  Patient identified on the Malnutrition Screening Tool (MST) Report  Pt and family report no recent weight changes. Pt lives with son. Family brings pt a lot of food. Per pt and family pt has a good appetite.   Wt Readings from Last 15 Encounters:  08/18/11 151 lb 7.3 oz (68.7 kg)  01/08/11 160 lb (72.576 kg)    Current diet order is Heart Healthy, which was just resumed after surgery. Labs and medications reviewed.   No nutrition interventions warranted at this time. If nutrition issues arise, please consult RD.   Kendell Bane RD, LDN, CNSC [phone] Pager [phone] After Hours Pager
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
## 2013-04-01 NOTE — Progress Notes (Signed)
Patient received from EP lab at 1500.  Speech garbled secondary to lack of dentures and accent.  Restless, non- compliant with  activity restrictions.  Family arrived at room approximately 1600.  Patient becoming increasingly agitated, noncompliant with sling use and arm position restrictions.  Patient transferred to room 3W12; agitation and noncompliance continue to escalate, two staff and two family members attempting to control patient and maintain safety and integrity of pacer wires secondary to vigorous movement of arms in all directions.  No sedation given per Dr. Jenel Lucks order.  Upon arrival to new room, receiving nurse paged Dr. Johney Frame, awaiting his arrival.  Ward Givens, NP, and Ronie Spies, PA on unit and advised.

## 2013-04-01 NOTE — Care Management Note (Signed)
    Page 1 of 1   04/01/2013     8:18:05 AM   CARE MANAGEMENT NOTE 04/01/2013  Patient:  [name], [name]   Account Number:  [id number]  Date Initiated:  04/01/2013  Documentation initiated by:  Junius Creamer  Subjective/Objective Assessment:   adm w complete heart block     Action/Plan:   lives alone, pcp dr Berdie Ogren nyland   Anticipated DC Date:     Anticipated DC Plan:        DC Planning Services  CM consult      Choice offered to / List presented to:             Status of service:   Medicare Important Message given?   (If response is "NO", the following Medicare IM given date fields will be blank) Date Medicare IM given:   Date Additional Medicare IM given:    Discharge Disposition:    Per UR Regulation:  Reviewed for med. necessity/level of care/duration of stay  If discussed at Long Length of Stay Meetings, dates discussed:    Comments:

## 2013-04-01 NOTE — Op Note (Signed)
SURGEON:  Hillis Range, MD     PREPROCEDURE DIAGNOSIS:  Symptomatic mobitz II second degree AV block    POSTPROCEDURE DIAGNOSIS:  Symptomatic mobitz II second degree AV block     PROCEDURES:   1. Pacemaker implantation.     INTRODUCTION: [name] is a 78 y.o. male  with a history of mobitz II second degree AV block who presents today for pacemaker implantation.  The patient reports intermittent episodes of syncope over the past few months, worse yesterday.  He presented with transient complete heart block requiring temporary pacing.  No reversible causes have been identified.  The patient therefore presents today for pacemaker implantation.     DESCRIPTION OF PROCEDURE:  Informed written consent was obtained, and the patient was brought to the electrophysiology lab in a fasting state.  The patient required no sedation for the procedure today.  The patients left chest was prepped and draped in the usual sterile fashion by the EP lab staff. The skin overlying the left deltopectoral region was infiltrated with lidocaine for local analgesia.  A 4-cm incision was made over the left deltopectoral region.  A left subcutaneous pacemaker pocket was fashioned using a combination of sharp and blunt dissection. Electrocautery was required to assure hemostasis.    RA/RV Lead Placement: The left axillary vein was cannulated.  Through the left axillary vein, a Medtronic model [id number] (serial number [id number]) right atrial lead and a Medtronic model 5092- 58 (serial number [id number]) right ventricular lead were advanced with fluoroscopic visualization into the right atrial appendage and right ventricular apex positions respectively.  Initial atrial lead P- waves measured 3 mV with impedance of 497 ohms and a threshold of 1 V at 0.5 msec.  Right ventricular lead R-waves measured 13 mV with an impedance of 637 ohms and a threshold of 0.6V V at 0.5 msec.  Both leads were secured to the pectoralis fascia using  #2-0 silk over the suture sleeves.   Device Placement:  The leads were then connected to a Medtronic [place] model [id number] (serial number [id number]) pacemaker.  The pocket was irrigated with copious gentamicin solution.  The pacemaker was then placed into the pocket.  The pocket was then closed in 2 layers with 2.0 Vicryl suture for the subcutaneous and subcuticular layers.  Steri- Strips and a sterile dressing were then applied.  There were no early apparent complications.     CONCLUSIONS:   1. Successful implantation of a Medtronic [place] dual-chamber pacemaker for symptomatic mobitz II second degree AV block  2. No early apparent complications.           Hillis Range, MD 04/01/2013 1:21 PM

## 2013-04-01 NOTE — Progress Notes (Addendum)
Pt arrived to unit from [facility] post-pacemaker insertion without sling applied to arm.  Pt continuously moving arm, attempted to replace arm sling. Pt continuously removes arm from sling and will not keep arm still..  Pt arm with bloody drainage oozing from pacemaker site.  MD notified to assess. MD arrived to floor, orders received to apply soft wrist restraint for minimized mobilization of left arm. Family at bedside and aware of situation. [name]

## 2013-04-02 ENCOUNTER — Inpatient Hospital Stay (HOSPITAL_COMMUNITY): Payer: PRIVATE HEALTH INSURANCE

## 2013-04-02 ENCOUNTER — Encounter (HOSPITAL_COMMUNITY): Payer: Self-pay | Admitting: Physician Assistant

## 2013-04-02 MED ORDER — VALSARTAN 320 MG PO TABS: 320.0000 mg | ORAL_TABLET | Freq: Every day | ORAL | Status: DC

## 2013-04-02 MED ORDER — HALOPERIDOL LACTATE 5 MG/ML IJ SOLN
5.0000 mg | Freq: Four times a day (QID) | INTRAMUSCULAR | Status: DC | PRN
  Administered 2013-04-02: 5 mg via INTRAVENOUS
  Filled 2013-04-02: qty 1
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
## 2013-04-02 NOTE — Progress Notes (Signed)
   SUBJECTIVE: The patient is less confused today.  At this time, he denies chest pain, shortness of breath, or any new concerns.  OBJECTIVE: Physical Exam: Filed Vitals:   04/01/13 1615 04/01/13 1630 04/01/13 2100 04/02/13 0500  BP: 154/56 146/77 132/55 152/63  Pulse: 59 97 60 78  Temp:   98.4 F (36.9 C) 99.5 F (37.5 C)  TempSrc:      Resp:   18 20  SpO2: 97% 84% 95% 94%    Intake/Output Summary (Last 24 hours) at 04/02/13 1610 Last data filed at 04/02/13 0700  Gross per 24 hour  Intake    303 ml  Output   1325 ml  Net  -1022 ml    Telemetry reveals sinus rhythm, V paced  GEN- The patient is elderly appearing, alert and less confused Head- normocephalic, atraumatic Eyes-  Sclera clear, conjunctiva pink Ears- hearing intact Oropharynx- clear Neck- supple,  Lungs- Clear to ausculation bilaterally, normal work of breathing Heart- Regular rate and rhythm  GI- soft, NT, ND, + BS Extremities- no clubbing, cyanosis, or edema Skin- pacemaker site is stable with pressure dressing in place  LABS: Basic Metabolic Panel:  Recent Labs  96/04/54 2037 03/31/13 2044 04/01/13 0435  NA 144 146* 143  K 4.4 4.4 4.1  CL 108 108 108  CO2 28  --  28  GLUCOSE 122* 119* 93  BUN 53* 51* 42*  CREATININE 1.55* 1.50* 1.21  CALCIUM 9.3  --  8.2*   Liver Function Tests:  Recent Labs  03/31/13 2037  AST 23  ALT 24  ALKPHOS 67  BILITOT 0.5  PROT 6.5  ALBUMIN 3.5   No results found for this basename: LIPASE, AMYLASE,  in the last 72 hours CBC:  Recent Labs  03/31/13 2037 03/31/13 2044 04/01/13 0435  WBC 11.5*  --  7.8  NEUTROABS 9.3*  --   --   HGB 13.0 13.6 12.0*  HCT 38.8* 40.0 35.3*  MCV 97.7  --  97.8  PLT 164  --  147*   Thyroid Function Tests:  Recent Labs  04/01/13 0835  TSH 1.358   Anemia Panel: No results found for this basename: VITAMINB12, FOLATE, FERRITIN, TIBC, IRON, RETICCTPCT,  in the last 72 hours  RADIOLOGY: Dg Chest Port 1  View  03/31/2013   *RADIOLOGY REPORT*  Clinical Data: Syncope.  PORTABLE CHEST - 1 VIEW  Comparison: PA and lateral chest 05/18/2012.  Findings: The patient is status post CABG.  Mild bibasilar atelectasis is seen.  Lungs otherwise clear.  Heart size normal. No pneumothorax or pleural fluid.  IMPRESSION: No acute disease.   Original Report Authenticated By: Holley Dexter, M.D.    ASSESSMENT AND PLAN:   1. Complete heart block Stable s/p PPM cxr reveals no obvious ptx Device interrogation is reviewed in detail and reveals stable parameters  2. CAD No ischemic symptoms Continue current medicine  3. Dementia Had delirium yesterday.  Appears better today.   Family is at bedside.  They feel that they can handle him at home and would prefer discharge today. Ambulate patient.  IF stable, then discharge today.   Will therefore plan discharge today. Keep pressure dressing on until Monday.  Remove tegaderm on Tuesday. He will come to the device clinic for wound check in 10 days I will see him in 3 months  Hillis Range, MD 04/02/2013 8:07 AM

## 2013-04-06 ENCOUNTER — Encounter: Payer: Self-pay | Admitting: *Deleted

## 2013-04-06 ENCOUNTER — Ambulatory Visit: Payer: PRIVATE HEALTH INSURANCE

## 2013-04-12 ENCOUNTER — Ambulatory Visit (INDEPENDENT_AMBULATORY_CARE_PROVIDER_SITE_OTHER): Payer: PRIVATE HEALTH INSURANCE | Admitting: *Deleted

## 2013-04-12 DIAGNOSIS — I498 Other specified cardiac arrhythmias: Secondary | ICD-10-CM

## 2013-04-12 DIAGNOSIS — R55 Syncope and collapse: Secondary | ICD-10-CM

## 2013-04-12 LAB — PACEMAKER DEVICE OBSERVATION
AL AMPLITUDE: 4 mv
ATRIAL PACING PM: 60
BAMS-0001: 150 {beats}/min
BATTERY VOLTAGE: 2.8 V
RV LEAD IMPEDENCE PM: 622 Ohm
RV LEAD THRESHOLD: 0.75 V
VENTRICULAR PACING PM: 2

## 2013-04-12 NOTE — Progress Notes (Signed)
Wound check-PPM in office. 

## 2013-04-13 ENCOUNTER — Ambulatory Visit: Payer: PRIVATE HEALTH INSURANCE | Admitting: Adult Health

## 2013-04-19 ENCOUNTER — Ambulatory Visit (INDEPENDENT_AMBULATORY_CARE_PROVIDER_SITE_OTHER): Payer: PRIVATE HEALTH INSURANCE | Admitting: Adult Health

## 2013-04-19 ENCOUNTER — Encounter: Payer: Self-pay | Admitting: Adult Health

## 2013-04-19 VITALS — BP 212/80 | HR 75 | Ht 65.0 in | Wt 158.0 lb

## 2013-04-19 DIAGNOSIS — I442 Atrioventricular block, complete: Secondary | ICD-10-CM

## 2013-04-19 DIAGNOSIS — I10 Essential (primary) hypertension: Secondary | ICD-10-CM

## 2013-04-19 MED ORDER — AMLODIPINE BESYLATE 5 MG PO TABS: 5.0000 mg | ORAL_TABLET | Freq: Every day | ORAL | Status: DC

## 2013-04-19 NOTE — Progress Notes (Deleted)
Name: [name]    DOB: 1921/11/06  Age: 79 y.o.  MR#: [id number]       PCP:  Josue Hector, MD      Insurance: Payor: MEDICARE / Plan: MEDICARE PART B / Product Type: *No Product type* /   CC:    Chief Complaint  Patient presents with  . Congestive Heart Failure    Diastolic  . Hypertension    VS Filed Vitals:   04/19/13 1422  BP: 212/80  Pulse: 75  Height: 5\' 5"  (1.651 m)  Weight: 158 lb (71.668 kg)    Weights Current Weight  04/19/13 158 lb (71.668 kg)  08/18/11 151 lb 7.3 oz (68.7 kg)  01/08/11 160 lb (72.576 kg)    Blood Pressure  BP Readings from Last 3 Encounters:  04/19/13 212/80  04/02/13 152/63  04/02/13 152/63     Admit date:  (Not on file) Last encounter with RMR:  Visit date not found   Allergy Review of patient's allergies indicates no known allergies.  Current Outpatient Prescriptions  Medication Sig Dispense Refill  . aspirin 81 MG tablet Take 81 mg by mouth daily.        [name] atorvastatin (LIPITOR) 20 MG tablet Take 1 tablet (20 mg total) by mouth daily.  30 tablet  0  . Memantine HCl ER (NAMENDA XR) 28 MG CP24 Take 28 mg by mouth every morning.      . nabumetone (RELAFEN) 500 MG tablet Take 1,000 mg by mouth 2 (two) times daily.       [name] omeprazole (PRILOSEC) 20 MG capsule Take 40 mg by mouth 2 (two) times daily.      . valsartan (DIOVAN) 320 MG tablet Take 1 tablet (320 mg total) by mouth daily.  30 tablet  3   No current facility-administered medications for this visit.    Discontinued Meds:   There are no discontinued medications.  Patient Active Problem List   Diagnosis Date Noted  . Coronary artery disease   . Aortic stenosis   . Hypertension   . Hyperlipidemia   . Degenerative joint disease of knee   . Syncope 11/26/2010    LABS    Component Value Date/Time   NA 143 04/01/2013 0435   NA 146* 03/31/2013 2044   NA 144 03/31/2013 2037   K 4.1 04/01/2013 0435   K 4.4 03/31/2013 2044   K 4.4 03/31/2013 2037   CL 108 04/01/2013 0435    CL 108 03/31/2013 2044   CL 108 03/31/2013 2037   CO2 28 04/01/2013 0435   CO2 28 03/31/2013 2037   CO2 32 05/18/2012 1425   GLUCOSE 93 04/01/2013 0435   GLUCOSE 119* 03/31/2013 2044   GLUCOSE 122* 03/31/2013 2037   BUN 42* 04/01/2013 0435   BUN 51* 03/31/2013 2044   BUN 53* 03/31/2013 2037   CREATININE 1.21 04/01/2013 0435   CREATININE 1.50* 03/31/2013 2044   CREATININE 1.55* 03/31/2013 2037   CALCIUM 8.2* 04/01/2013 0435   CALCIUM 9.3 03/31/2013 2037   CALCIUM 9.8 05/18/2012 1425   GFRNONAA 51* 04/01/2013 0435   GFRNONAA 38* 03/31/2013 2037   GFRNONAA 47* 05/18/2012 1425   GFRAA 59* 04/01/2013 0435   GFRAA 44* 03/31/2013 2037   GFRAA 54* 05/18/2012 1425   CMP     Component Value Date/Time   NA 143 04/01/2013 0435   K 4.1 04/01/2013 0435   CL 108 04/01/2013 0435   CO2 28 04/01/2013 0435   GLUCOSE 93 04/01/2013 0435  BUN 42* 04/01/2013 0435   CREATININE 1.21 04/01/2013 0435   CALCIUM 8.2* 04/01/2013 0435   PROT 6.5 03/31/2013 2037   ALBUMIN 3.5 03/31/2013 2037   AST 23 03/31/2013 2037   ALT 24 03/31/2013 2037   ALKPHOS 67 03/31/2013 2037   BILITOT 0.5 03/31/2013 2037   GFRNONAA 51* 04/01/2013 0435   GFRAA 59* 04/01/2013 0435       Component Value Date/Time   WBC 7.8 04/01/2013 0435   WBC 11.5* 03/31/2013 2037   WBC 15.7* 05/18/2012 1425   HGB 12.0* 04/01/2013 0435   HGB 13.6 03/31/2013 2044   HGB 13.0 03/31/2013 2037   HCT 35.3* 04/01/2013 0435   HCT 40.0 03/31/2013 2044   HCT 38.8* 03/31/2013 2037   MCV 97.8 04/01/2013 0435   MCV 97.7 03/31/2013 2037   MCV 94.8 05/18/2012 1425    Lipid Panel     Component Value Date/Time   CHOL 108 12/13/2010 0605   TRIG 70 12/13/2010 0605   HDL 32* 12/13/2010 0605   CHOLHDL 3.4 12/13/2010 0605   VLDL 14 12/13/2010 0605   LDLCALC  Value: 62        Total Cholesterol/HDL:CHD Risk Coronary Heart Disease Risk Table                     Men   Women  1/2 Average Risk   3.4   3.3  Average Risk       5.0   4.4  2 X Average Risk   9.6   7.1  3 X Average Risk  23.4   11.0        Use the calculated Patient Ratio above  and the CHD Risk Table to determine the patient's CHD Risk.        ATP III CLASSIFICATION (LDL):  <100     mg/dL   Optimal  629-528  mg/dL   Near or Above                    Optimal  130-159  mg/dL   Borderline  413-244  mg/dL   High  >010     mg/dL   Very High 2/72/5366 4403    ABG    Component Value Date/Time   TCO2 28 03/31/2013 2044     Lab Results  Component Value Date   TSH 1.358 04/01/2013   BNP (last 3 results) No results found for this basename: PROBNP,  in the last 8760 hours Cardiac Panel (last 3 results) No results found for this basename: CKTOTAL, CKMB, TROPONINI, RELINDX,  in the last 72 hours  Iron/TIBC/Ferritin No results found for this basename: iron, tibc, ferritin     EKG Orders placed during the hospital encounter of 03/31/13  . EKG 12-LEAD  . EKG 12-LEAD  . EKG  . EKG 12-LEAD  . EKG 12-LEAD     Prior Assessment and Plan Problem List as of 04/19/2013     Cardiovascular and Mediastinum   Coronary artery disease   Aortic stenosis   Hypertension   Last Assessment & Plan   01/08/2011 Office Visit Edited 01/12/2011  8:11 PM by Kathlen Brunswick, MD     Blood pressure control is marginal.  Patient will monitor blood pressure at home and call for multiple systolics in excess of 140.  Dr. Lysbeth Galas will arrange for appropriate laboratory testing as necessary.  I will plan to see this extraordinary gentlemen again in one year.    Syncope  Last Assessment & Plan   01/08/2011 Office Visit Written 01/08/2011  2:49 PM by Kathlen Brunswick, MD     Patient is not orthostatic at this visit, but certainly could have been at that time he lost consciousness.  Echocardiography and exam adequately indicated the absence of important aortic stenosis.  There is no suggestion that coronary artery disease could have accounted for his loss of consciousness.  Stress testing would not likely assist in our diagnostic efforts and will be deferred.      Musculoskeletal and Integument    Degenerative joint disease of knee     Other   Hyperlipidemia       Imaging: Dg Chest 2 View  04/02/2013   *RADIOLOGY REPORT*  Clinical Data: Status post pacemaker placement.  CHEST - 2 VIEW  Comparison: Chest x-ray 03/31/2013.  Findings: New left sided pacemaker device with lead tips projecting over the expected location of the right atrium and right ventricular apex.  No definite pneumothorax.  Lung volumes are low. No acute consolidative airspace disease.  No pleural effusions. Mild cephalization of the pulmonary vasculature, without frank pulmonary edema.  Heart size is normal.  Mediastinal contours are unremarkable.  Mild diffuse peribronchial cuffing appears similar to prior studies.  Atherosclerosis of the thoracic aorta.  Status post median sternotomy for CABG.  IMPRESSION: 1.  Status post pacemaker placement, as above, without acute complicating features. 2.  Mild cephalization of the pulmonary vasculature, without frank pulmonary edema. 3.  Mild diffuse peribronchial cuffing is similar to the prior examination.  Whether not this reflects acute or chronic bronchitis is uncertain.   Original Report Authenticated By: Trudie Reed, M.D.   Dg Chest Port 1 View  03/31/2013   *RADIOLOGY REPORT*  Clinical Data: Syncope.  PORTABLE CHEST - 1 VIEW  Comparison: PA and lateral chest 05/18/2012.  Findings: The patient is status post CABG.  Mild bibasilar atelectasis is seen.  Lungs otherwise clear.  Heart size normal. No pneumothorax or pleural fluid.  IMPRESSION: No acute disease.   Original Report Authenticated By:  Dexter, M.D.

## 2013-04-19 NOTE — Patient Instructions (Signed)
Your physician recommends that you schedule a follow-up appointment in: 1 month with Joni Reining, NP  Your physician has recommended you make the following change in your medication:  1. Norvasc 5 mg daily.  Your physician has requested that you regularly monitor and record your blood pressure readings at home. Please use the same machine at the same time of day to check your readings and record them to bring to your follow-up visit.  Please come to the office in one week for a BP check. Please bring your reading with you to this appointment.

## 2013-04-19 NOTE — Assessment & Plan Note (Signed)
No complaints of pain or irritation from pacemaker. It has been interrogated last week. He will continue follow up appts as directed.

## 2013-04-19 NOTE — Assessment & Plan Note (Signed)
Blood pressure is not well controlled today. It has been rechecked twice. He denies salty food intake. I will begin amlodipine 5 mg daily. He will come back in a week for BP check and keep a log of his BP at home. He is asked to take the BP at the same time every day, avoid salty foods. He will been seen in one month.
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
## 2013-04-20 NOTE — Addendum Note (Signed)
Addended by: Thompson Grayer on: 04/20/2013 08:53 AM   Modules accepted: Orders

## 2013-04-26 ENCOUNTER — Ambulatory Visit (INDEPENDENT_AMBULATORY_CARE_PROVIDER_SITE_OTHER): Payer: PRIVATE HEALTH INSURANCE | Admitting: *Deleted

## 2013-04-26 VITALS — BP 166/74 | HR 68 | Ht 65.0 in | Wt 152.0 lb

## 2013-04-26 DIAGNOSIS — I10 Essential (primary) hypertension: Secondary | ICD-10-CM

## 2013-04-26 NOTE — Progress Notes (Signed)
Patient presents to office for BP check due to recent medication change including adding norvasc 5 mg daily. Patient denies having chest pain, dizziness or sob. Patient has taken all doses of medications without missing any doses. Patient states he feels well today. Patient also brought in today a log of his BP and HR for review. Daughter with patient today and informed that information would be sent to NP today, and she would be contacted with response.

## 2013-04-30 ENCOUNTER — Encounter: Payer: Self-pay | Admitting: Internal Medicine

## 2013-05-05 NOTE — Progress Notes (Signed)
Please advise 

## 2013-05-05 NOTE — Progress Notes (Signed)
Blood pressure is much better than on last office visit. Will need to titrate the medication up on next visit, but will wait to do so, slowly. Continue to keep BP recordings for me to review on next visit.

## 2013-05-10 NOTE — Progress Notes (Signed)
Spoke to pt son to advise results/instructions. Pt son understood.

## 2013-05-26 ENCOUNTER — Encounter: Payer: PRIVATE HEALTH INSURANCE | Admitting: Adult Health
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
## 2013-05-30 ENCOUNTER — Ambulatory Visit (INDEPENDENT_AMBULATORY_CARE_PROVIDER_SITE_OTHER): Payer: PRIVATE HEALTH INSURANCE | Admitting: Adult Health

## 2013-05-30 ENCOUNTER — Encounter: Payer: Self-pay | Admitting: Adult Health

## 2013-05-30 VITALS — BP 166/78 | HR 82 | Ht 66.0 in | Wt 150.0 lb

## 2013-05-30 DIAGNOSIS — E785 Hyperlipidemia, unspecified: Secondary | ICD-10-CM

## 2013-05-30 DIAGNOSIS — I2510 Atherosclerotic heart disease of native coronary artery without angina pectoris: Secondary | ICD-10-CM

## 2013-05-30 DIAGNOSIS — I359 Nonrheumatic aortic valve disorder, unspecified: Secondary | ICD-10-CM

## 2013-05-30 DIAGNOSIS — I10 Essential (primary) hypertension: Secondary | ICD-10-CM

## 2013-05-30 DIAGNOSIS — I350 Nonrheumatic aortic (valve) stenosis: Secondary | ICD-10-CM

## 2013-05-30 NOTE — Patient Instructions (Addendum)
Your physician recommends that you schedule a follow-up appointment in: 6 months You will receive a reminder letter two months in advance reminding you to call and schedule your appointment. If you don't receive this letter, please contact our office.  Your physician recommends that you continue on your current medications as directed. Please refer to the Current Medication list given to you today.     

## 2013-05-30 NOTE — Assessment & Plan Note (Signed)
No further cardiac testing in planned at this time.

## 2013-05-30 NOTE — Assessment & Plan Note (Signed)
Should have lipids and LFT's completed. He is followed by PCP for labs.

## 2013-05-30 NOTE — Assessment & Plan Note (Signed)
He has had no symptoms of chest pain or Dyspnea. Will BP better controlled, would like to repeat echo in 6 months for re-evaluation of status.

## 2013-05-30 NOTE — Assessment & Plan Note (Signed)
Blood pressure is much better controlled with addition of amlodipine.  Avg BP is 140's range. I will continue current medications as directed. He will be seen again in 6 months unless he his having problems.
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
## 2013-06-30 ENCOUNTER — Encounter: Payer: Medicare Other | Admitting: Internal Medicine

## 2013-07-04 ENCOUNTER — Encounter: Payer: PRIVATE HEALTH INSURANCE | Admitting: Internal Medicine

## 2013-07-06 ENCOUNTER — Encounter: Payer: PRIVATE HEALTH INSURANCE | Admitting: Internal Medicine

## 2013-08-01 ENCOUNTER — Encounter: Payer: Self-pay | Admitting: Internal Medicine

## 2013-08-01 ENCOUNTER — Ambulatory Visit (INDEPENDENT_AMBULATORY_CARE_PROVIDER_SITE_OTHER): Payer: PRIVATE HEALTH INSURANCE | Admitting: Internal Medicine

## 2013-08-01 VITALS — BP 146/87 | HR 78 | Ht 64.0 in | Wt 144.0 lb

## 2013-08-01 DIAGNOSIS — I359 Nonrheumatic aortic valve disorder, unspecified: Secondary | ICD-10-CM

## 2013-08-01 DIAGNOSIS — I2510 Atherosclerotic heart disease of native coronary artery without angina pectoris: Secondary | ICD-10-CM

## 2013-08-01 DIAGNOSIS — I442 Atrioventricular block, complete: Secondary | ICD-10-CM

## 2013-08-01 DIAGNOSIS — I350 Nonrheumatic aortic (valve) stenosis: Secondary | ICD-10-CM

## 2013-08-01 LAB — MDC_IDC_ENUM_SESS_TYPE_INCLINIC
Battery Impedance: 100 Ohm
Brady Statistic AP VP Percent: 63 %
Brady Statistic AP VS Percent: 9 %
Brady Statistic AS VP Percent: 25 %
Brady Statistic AS VS Percent: 3 %
Date Time Interrogation Session: 20150105111942
Lead Channel Impedance Value: 432 Ohm
Lead Channel Impedance Value: 561 Ohm
Lead Channel Pacing Threshold Amplitude: 0.5 V
Lead Channel Pacing Threshold Pulse Width: 0.4 ms
Lead Channel Setting Pacing Amplitude: 2.5 V
Lead Channel Setting Sensing Sensitivity: 4 mV
MDC IDC MSMT BATTERY REMAINING LONGEVITY: 129 mo
MDC IDC MSMT BATTERY VOLTAGE: 2.8 V
MDC IDC MSMT LEADCHNL RA SENSING INTR AMPL: 5.6 mV
MDC IDC MSMT LEADCHNL RV PACING THRESHOLD AMPLITUDE: 0.75 V
MDC IDC MSMT LEADCHNL RV PACING THRESHOLD PULSEWIDTH: 0.4 ms
MDC IDC SET LEADCHNL RA PACING AMPLITUDE: 2 V
MDC IDC SET LEADCHNL RV PACING PULSEWIDTH: 0.4 ms
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
## 2013-08-01 NOTE — Patient Instructions (Signed)
   [facility] 11/02/2013  Continue all current medications. Your physician wants you to follow up in: 9 months.  You will receive a reminder letter in the mail one-two months in advance.  If you don't receive a letter, please call our office to schedule the follow up appointment - Dr. Rayann Heman

## 2013-08-02 ENCOUNTER — Other Ambulatory Visit (HOSPITAL_COMMUNITY): Payer: Self-pay | Admitting: Physician Assistant

## 2013-11-01 ENCOUNTER — Other Ambulatory Visit: Payer: Self-pay | Admitting: Internal Medicine

## 2013-11-02 ENCOUNTER — Encounter: Payer: Medicare Other | Admitting: *Deleted

## 2013-11-10 ENCOUNTER — Encounter: Payer: Self-pay | Admitting: *Deleted

## 2013-11-12 ENCOUNTER — Emergency Department (HOSPITAL_COMMUNITY): Payer: PRIVATE HEALTH INSURANCE

## 2013-11-12 ENCOUNTER — Encounter (HOSPITAL_COMMUNITY): Payer: Self-pay | Admitting: Emergency Medicine

## 2013-11-12 ENCOUNTER — Emergency Department (HOSPITAL_COMMUNITY)
Admission: EM | Admit: 2013-11-12 | Discharge: 2013-11-12 | Disposition: A | Discharge status: Home / Self Care | Payer: PRIVATE HEALTH INSURANCE | Attending: Emergency Medicine | Admitting: Emergency Medicine

## 2013-11-12 DIAGNOSIS — Z79899 Other long term (current) drug therapy: Secondary | ICD-10-CM | POA: Insufficient documentation

## 2013-11-12 DIAGNOSIS — Z951 Presence of aortocoronary bypass graft: Secondary | ICD-10-CM | POA: Insufficient documentation

## 2013-11-12 DIAGNOSIS — E785 Hyperlipidemia, unspecified: Secondary | ICD-10-CM | POA: Insufficient documentation

## 2013-11-12 DIAGNOSIS — I10 Essential (primary) hypertension: Secondary | ICD-10-CM | POA: Insufficient documentation

## 2013-11-12 DIAGNOSIS — Z950 Presence of cardiac pacemaker: Secondary | ICD-10-CM | POA: Insufficient documentation

## 2013-11-12 DIAGNOSIS — I2510 Atherosclerotic heart disease of native coronary artery without angina pectoris: Secondary | ICD-10-CM | POA: Insufficient documentation

## 2013-11-12 DIAGNOSIS — Y9389 Activity, other specified: Secondary | ICD-10-CM | POA: Insufficient documentation

## 2013-11-12 DIAGNOSIS — Z043 Encounter for examination and observation following other accident: Secondary | ICD-10-CM | POA: Insufficient documentation

## 2013-11-12 DIAGNOSIS — Y929 Unspecified place or not applicable: Secondary | ICD-10-CM | POA: Insufficient documentation

## 2013-11-12 DIAGNOSIS — N390 Urinary tract infection, site not specified: Secondary | ICD-10-CM

## 2013-11-12 DIAGNOSIS — R296 Repeated falls: Secondary | ICD-10-CM | POA: Insufficient documentation

## 2013-11-12 DIAGNOSIS — Z8549 Personal history of malignant neoplasm of other male genital organs: Secondary | ICD-10-CM | POA: Insufficient documentation

## 2013-11-12 DIAGNOSIS — F0390 Unspecified dementia without behavioral disturbance: Secondary | ICD-10-CM | POA: Insufficient documentation

## 2013-11-12 DIAGNOSIS — W19XXXA Unspecified fall, initial encounter: Secondary | ICD-10-CM

## 2013-11-12 DIAGNOSIS — R011 Cardiac murmur, unspecified: Secondary | ICD-10-CM | POA: Insufficient documentation

## 2013-11-12 DIAGNOSIS — K219 Gastro-esophageal reflux disease without esophagitis: Secondary | ICD-10-CM | POA: Insufficient documentation

## 2013-11-12 DIAGNOSIS — Z87891 Personal history of nicotine dependence: Secondary | ICD-10-CM | POA: Insufficient documentation

## 2013-11-12 DIAGNOSIS — Z7982 Long term (current) use of aspirin: Secondary | ICD-10-CM | POA: Insufficient documentation

## 2013-11-12 LAB — BASIC METABOLIC PANEL
BUN: 23 mg/dL (ref 6–23)
CO2: 26 mEq/L (ref 19–32)
Calcium: 9 mg/dL (ref 8.4–10.5)
Chloride: 105 mEq/L (ref 96–112)
Creatinine, Ser: 1.12 mg/dL (ref 0.50–1.35)
GFR calc Af Amer: 65 mL/min — ABNORMAL LOW (ref >90)
GFR calc non Af Amer: 56 mL/min — ABNORMAL LOW (ref >90)
Glucose, Bld: 123 mg/dL — ABNORMAL HIGH (ref 70–99)
Potassium: 4.2 mEq/L (ref 3.7–5.3)
Sodium: 142 mEq/L (ref 137–147)

## 2013-11-12 LAB — CBC
HCT: 38.7 % — ABNORMAL LOW (ref 39.0–52.0)
Hemoglobin: 12.9 g/dL — ABNORMAL LOW (ref 13.0–17.0)
MCH: 31.8 pg (ref 26.0–34.0)
MCHC: 33.3 g/dL (ref 30.0–36.0)
MCV: 95.3 fL (ref 78.0–100.0)
Platelets: 159 10*3/uL (ref 150–400)
RBC: 4.06 MIL/uL — ABNORMAL LOW (ref 4.22–5.81)
RDW: 14.2 % (ref 11.5–15.5)
WBC: 9.5 10*3/uL (ref 4.0–10.5)

## 2013-11-12 LAB — URINALYSIS, ROUTINE W REFLEX MICROSCOPIC
Bilirubin Urine: NEGATIVE
Glucose, UA: NEGATIVE mg/dL
Ketones, ur: NEGATIVE mg/dL
Nitrite: POSITIVE — AB
Protein, ur: NEGATIVE mg/dL
Specific Gravity, Urine: 1.013 (ref 1.005–1.030)
Urobilinogen, UA: 0.2 mg/dL (ref 0.0–1.0)
pH: 6 (ref 5.0–8.0)

## 2013-11-12 LAB — URINE MICROSCOPIC-ADD ON

## 2013-11-12 MED ORDER — CEPHALEXIN 500 MG PO CAPS: 1000.0000 mg | ORAL_CAPSULE | Freq: Two times a day (BID) | ORAL | Status: DC

## 2013-11-12 MED ORDER — CEPHALEXIN 250 MG PO CAPS
1000.0000 mg | ORAL_CAPSULE | Freq: Once | ORAL | Status: AC
  Administered 2013-11-12: 1000 mg via ORAL
  Filled 2013-11-12: qty 4
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
## 2013-11-12 NOTE — Progress Notes (Signed)
Met With patient and Melida Gimenez at bedside.Family provided with private sitter list.Son reports he will call ALF to follow up on fathers application for placement.CM provided her contact number  To Antony Haste should he have any further questions.No further CM needs at this time.

## 2013-11-12 NOTE — ED Notes (Signed)
Joe, [employer] student at bedside

## 2013-11-12 NOTE — Discharge Instructions (Signed)
Return here as needed.  Followup with your Dr. for further evaluation and recheck

## 2013-11-12 NOTE — ED Notes (Signed)
Pt to ED via [facility] c/o of falls x 2 days ago. Pt reports "falling a lot at home." Hx of dementia per EMS; denies pain at this time
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
## 2013-11-14 NOTE — ED Provider Notes (Signed)
Medical screening examination/treatment/procedure(s) were conducted as a shared visit with non-physician practitioner(s) and myself.  I personally evaluated the patient during the encounter.   EKG Interpretation None      I interviewed and examined the patient. Lungs are CTAB. Systolic murmur head on cardiac exam. Abdomen soft. Pt is in good spirits and well appearing. I interpreted/reviewed the labs and/or imaging which were non-contributory.  Family working on nursing home placement, they are comfortable taking pt home.   Blanchard Kelch, MD 11/14/13 1016

## 2013-11-15 LAB — URINE CULTURE: Colony Count: >=100000

## 2013-11-16 ENCOUNTER — Telehealth (HOSPITAL_BASED_OUTPATIENT_CLINIC_OR_DEPARTMENT_OTHER): Payer: Self-pay | Admitting: Emergency Medicine

## 2013-11-16 NOTE — Telephone Encounter (Signed)
Post ED Visit - Positive Culture Follow-up  Culture report reviewed by antimicrobial stewardship pharmacist: []  Wes Dulaney, Pharm.D., BCPS []  Heide Guile, Pharm.D., BCPS []  Alycia Rossetti, Pharm.D., BCPS []  [place], [place].D., BCPS, AAHIVP []  Legrand Como, Pharm.D., BCPS, AAHIVP [x]  Juliene Pina, Pharm.D.  Positive urine culture Per [facility] PA-C, no change needed and no further patient follow-up is required at this time.  [name] 11/16/2013, 11:15 AM

## 2013-12-30 ENCOUNTER — Encounter: Payer: Self-pay | Admitting: Cardiology

## 2014-04-07 ENCOUNTER — Encounter (HOSPITAL_COMMUNITY): Payer: Self-pay | Admitting: Emergency Medicine

## 2014-04-07 ENCOUNTER — Emergency Department (HOSPITAL_COMMUNITY): Payer: PRIVATE HEALTH INSURANCE

## 2014-04-07 ENCOUNTER — Emergency Department (HOSPITAL_COMMUNITY)
Admission: EM | Admit: 2014-04-07 | Discharge: 2014-04-10 | Disposition: A | Discharge status: Home / Self Care | Payer: PRIVATE HEALTH INSURANCE | Attending: Emergency Medicine | Admitting: Emergency Medicine

## 2014-04-07 DIAGNOSIS — Z87828 Personal history of other (healed) physical injury and trauma: Secondary | ICD-10-CM | POA: Insufficient documentation

## 2014-04-07 DIAGNOSIS — Z87448 Personal history of other diseases of urinary system: Secondary | ICD-10-CM | POA: Diagnosis not present

## 2014-04-07 DIAGNOSIS — F02818 Dementia in other diseases classified elsewhere, unspecified severity, with other behavioral disturbance: Secondary | ICD-10-CM | POA: Diagnosis not present

## 2014-04-07 DIAGNOSIS — K219 Gastro-esophageal reflux disease without esophagitis: Secondary | ICD-10-CM | POA: Diagnosis not present

## 2014-04-07 DIAGNOSIS — N5089 Other specified disorders of the male genital organs: Secondary | ICD-10-CM | POA: Diagnosis not present

## 2014-04-07 DIAGNOSIS — F0390 Unspecified dementia without behavioral disturbance: Secondary | ICD-10-CM | POA: Diagnosis present

## 2014-04-07 DIAGNOSIS — Z951 Presence of aortocoronary bypass graft: Secondary | ICD-10-CM | POA: Insufficient documentation

## 2014-04-07 DIAGNOSIS — M129 Arthropathy, unspecified: Secondary | ICD-10-CM | POA: Insufficient documentation

## 2014-04-07 DIAGNOSIS — Z8549 Personal history of malignant neoplasm of other male genital organs: Secondary | ICD-10-CM | POA: Diagnosis not present

## 2014-04-07 DIAGNOSIS — I10 Essential (primary) hypertension: Secondary | ICD-10-CM | POA: Diagnosis not present

## 2014-04-07 DIAGNOSIS — Z8739 Personal history of other diseases of the musculoskeletal system and connective tissue: Secondary | ICD-10-CM | POA: Diagnosis not present

## 2014-04-07 DIAGNOSIS — Z008 Encounter for other general examination: Secondary | ICD-10-CM | POA: Insufficient documentation

## 2014-04-07 DIAGNOSIS — Z7982 Long term (current) use of aspirin: Secondary | ICD-10-CM | POA: Insufficient documentation

## 2014-04-07 DIAGNOSIS — F0281 Dementia in other diseases classified elsewhere with behavioral disturbance: Secondary | ICD-10-CM | POA: Diagnosis not present

## 2014-04-07 DIAGNOSIS — E785 Hyperlipidemia, unspecified: Secondary | ICD-10-CM | POA: Diagnosis not present

## 2014-04-07 DIAGNOSIS — I2510 Atherosclerotic heart disease of native coronary artery without angina pectoris: Secondary | ICD-10-CM | POA: Insufficient documentation

## 2014-04-07 DIAGNOSIS — Z79899 Other long term (current) drug therapy: Secondary | ICD-10-CM | POA: Diagnosis not present

## 2014-04-07 DIAGNOSIS — Z87891 Personal history of nicotine dependence: Secondary | ICD-10-CM | POA: Insufficient documentation

## 2014-04-07 DIAGNOSIS — R451 Restlessness and agitation: Secondary | ICD-10-CM

## 2014-04-07 DIAGNOSIS — F0391 Unspecified dementia with behavioral disturbance: Secondary | ICD-10-CM

## 2014-04-07 LAB — URINALYSIS, ROUTINE W REFLEX MICROSCOPIC
Bilirubin Urine: NEGATIVE
GLUCOSE, UA: NEGATIVE mg/dL
Hgb urine dipstick: NEGATIVE
KETONES UR: NEGATIVE mg/dL
LEUKOCYTES UA: NEGATIVE
Nitrite: NEGATIVE
PROTEIN: NEGATIVE mg/dL
Specific Gravity, Urine: 1.012 (ref 1.005–1.030)
UROBILINOGEN UA: 1 mg/dL (ref 0.0–1.0)
pH: 6.5 (ref 5.0–8.0)

## 2014-04-07 LAB — COMPREHENSIVE METABOLIC PANEL
ALT: 14 U/L (ref 0–53)
ANION GAP: 11 (ref 5–15)
AST: 17 U/L (ref 0–37)
Albumin: 3.8 g/dL (ref 3.5–5.2)
Alkaline Phosphatase: 78 U/L (ref 39–117)
BILIRUBIN TOTAL: 0.6 mg/dL (ref 0.3–1.2)
BUN: 27 mg/dL — AB (ref 6–23)
CALCIUM: 9.2 mg/dL (ref 8.4–10.5)
CHLORIDE: 103 meq/L (ref 96–112)
CO2: 27 meq/L (ref 19–32)
CREATININE: 0.92 mg/dL (ref 0.50–1.35)
GFR, EST AFRICAN AMERICAN: 84 mL/min — AB (ref >90)
GFR, EST NON AFRICAN AMERICAN: 72 mL/min — AB (ref >90)
GLUCOSE: 102 mg/dL — AB (ref 70–99)
Potassium: 4.1 mEq/L (ref 3.7–5.3)
Sodium: 141 mEq/L (ref 137–147)
Total Protein: 7.1 g/dL (ref 6.0–8.3)

## 2014-04-07 LAB — CBC WITH DIFFERENTIAL/PLATELET
BASOS ABS: 0 10*3/uL (ref 0.0–0.1)
Basophils Relative: 0 % (ref 0–1)
EOS PCT: 2 % (ref 0–5)
Eosinophils Absolute: 0.1 10*3/uL (ref 0.0–0.7)
HEMATOCRIT: 39.5 % (ref 39.0–52.0)
HEMOGLOBIN: 13.3 g/dL (ref 13.0–17.0)
LYMPHS PCT: 24 % (ref 12–46)
Lymphs Abs: 1.4 10*3/uL (ref 0.7–4.0)
MCH: 32.4 pg (ref 26.0–34.0)
MCHC: 33.7 g/dL (ref 30.0–36.0)
MCV: 96.1 fL (ref 78.0–100.0)
MONO ABS: 0.5 10*3/uL (ref 0.1–1.0)
MONOS PCT: 8 % (ref 3–12)
NEUTROS ABS: 3.8 10*3/uL (ref 1.7–7.7)
Neutrophils Relative %: 66 % (ref 43–77)
Platelets: 159 10*3/uL (ref 150–400)
RBC: 4.11 MIL/uL — ABNORMAL LOW (ref 4.22–5.81)
RDW: 13.7 % (ref 11.5–15.5)
WBC: 5.8 10*3/uL (ref 4.0–10.5)

## 2014-04-07 MED ORDER — MEMANTINE HCL ER 28 MG PO CP24
28.0000 mg | ORAL_CAPSULE | Freq: Every morning | ORAL | Status: DC
Start: 2014-04-08 — End: 2014-04-07

## 2014-04-07 MED ORDER — MEMANTINE HCL ER 28 MG PO CP24: 28.0000 mg | ORAL_CAPSULE | Freq: Every morning | ORAL | Status: DC

## 2014-04-07 MED ORDER — AMLODIPINE BESYLATE 5 MG PO TABS
5.0000 mg | ORAL_TABLET | Freq: Every day | ORAL | Status: DC
  Administered 2014-04-08 – 2014-04-10 (×3): 5 mg via ORAL
  Filled 2014-04-07 (×4): qty 1

## 2014-04-07 MED ORDER — PANTOPRAZOLE SODIUM 40 MG PO TBEC
40.0000 mg | DELAYED_RELEASE_TABLET | Freq: Every day | ORAL | Status: DC
  Administered 2014-04-08 – 2014-04-10 (×3): 40 mg via ORAL
  Filled 2014-04-07 (×4): qty 1

## 2014-04-07 MED ORDER — IRBESARTAN 300 MG PO TABS
300.0000 mg | ORAL_TABLET | Freq: Every day | ORAL | Status: DC
  Administered 2014-04-08 – 2014-04-10 (×3): 300 mg via ORAL
  Filled 2014-04-07 (×4): qty 1

## 2014-04-07 MED ORDER — MELATONIN 3 MG PO TABS: 3.0000 mg | ORAL_TABLET | Freq: Every day | ORAL | Status: DC

## 2014-04-07 MED ORDER — ASPIRIN 81 MG PO CHEW
81.0000 mg | CHEWABLE_TABLET | Freq: Every day | ORAL | Status: DC
  Administered 2014-04-08 – 2014-04-10 (×3): 81 mg via ORAL
  Filled 2014-04-07 (×3): qty 1

## 2014-04-07 MED ORDER — ATORVASTATIN CALCIUM 10 MG PO TABS
10.0000 mg | ORAL_TABLET | Freq: Every day | ORAL | Status: DC
  Administered 2014-04-08: 10 mg via ORAL
  Filled 2014-04-07 (×3): qty 1

## 2014-04-07 MED ORDER — HALOPERIDOL LACTATE 5 MG/ML IJ SOLN
2.0000 mg | Freq: Once | INTRAMUSCULAR | Status: AC
  Administered 2014-04-07: 2 mg via INTRAVENOUS
  Filled 2014-04-07: qty 1

## 2014-04-07 MED ORDER — HALOPERIDOL 1 MG PO TABS
1.0000 mg | ORAL_TABLET | Freq: Two times a day (BID) | ORAL | Status: DC
  Administered 2014-04-08: 1 mg via ORAL
  Filled 2014-04-07 (×3): qty 1

## 2014-04-07 NOTE — ED Notes (Signed)
Family contact info   [place] [phone] or [phone]- pt's daughter Gaspar Cola [phone] - pt's granddaughter
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
## 2014-04-07 NOTE — Progress Notes (Deleted)
Clinical Social Work Department BRIEF PSYCHOSOCIAL ASSESSMENT 04/07/2014  Patient:  [name]     Account Number:  [id number]     Admit date:  04/07/2014  Clinical Social Worker:  Luretha Rued  Date/Time:  04/07/2014 04:00 PM  Referred by:  CSW  Date Referred:  04/07/2014  Other Referral:   Interview type:  Patient Other interview type:   Daughter was at bedside    PSYCHOSOCIAL DATA Living Status:  WITH ADULT CHILDREN Admitted from facility:   Level of care:   Primary support name:  [name] Primary support relationship to patient:  CHILD, ADULT Degree of support available:   high level of support    CURRENT CONCERNS  Other Concerns:    SOCIAL WORK ASSESSMENT / PLAN CSW met with patient and daughter at bedside to complete this assessment.  Patient appears drowsy due to medications and unable to answer all questions appropriately.  Daughter reports that the patient lives with her and she is the primary caregiver.  It was reported that the patient does not have a POA.  Paitent has some medical equipment in the home such as walker, cane, and shower chair.  Daughter reports that the patient does need any other resources in the community.  CSW was requested to speak with the patient's son [name] [phone].  He reports that his mother is not recieving good care from his sister and he would like to the patient home with him after discharge.  He was requesting information on advanced directives.  CSW informed the son that the mother would have to be mental clear before the paperwork could be presented of signed. He agreed in understanding and will follow up with the floor CSW.   Assessment/plan status:  Psychosocial Support/Ongoing Assessment of Needs Other assessment/ plan:   none   Information/referral to community resources:    PATIENT'S/FAMILY'S RESPONSE TO PLAN OF CARE: Family expressed appreciation for the support of the social work department.      Chesley Noon, MSW, [place], 04/07/2014  Evening Clinical Social Worker [phone]
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
## 2014-04-08 ENCOUNTER — Encounter (HOSPITAL_COMMUNITY): Payer: Self-pay | Admitting: Psychiatry

## 2014-04-08 DIAGNOSIS — F0390 Unspecified dementia without behavioral disturbance: Secondary | ICD-10-CM | POA: Diagnosis present

## 2014-04-08 DIAGNOSIS — R451 Restlessness and agitation: Secondary | ICD-10-CM | POA: Diagnosis present

## 2014-04-08 DIAGNOSIS — F03918 Unspecified dementia, unspecified severity, with other behavioral disturbance: Secondary | ICD-10-CM

## 2014-04-08 DIAGNOSIS — F02818 Dementia in other diseases classified elsewhere, unspecified severity, with other behavioral disturbance: Secondary | ICD-10-CM | POA: Diagnosis not present

## 2014-04-08 DIAGNOSIS — IMO0002 Reserved for concepts with insufficient information to code with codable children: Secondary | ICD-10-CM

## 2014-04-08 DIAGNOSIS — F0281 Dementia in other diseases classified elsewhere with behavioral disturbance: Secondary | ICD-10-CM | POA: Diagnosis not present

## 2014-04-08 DIAGNOSIS — F0391 Unspecified dementia with behavioral disturbance: Secondary | ICD-10-CM

## 2014-04-08 MED ORDER — QUETIAPINE FUMARATE 25 MG PO TABS
25.0000 mg | ORAL_TABLET | Freq: Three times a day (TID) | ORAL | Status: DC
  Administered 2014-04-08: 25 mg via ORAL
  Filled 2014-04-08: qty 1

## 2014-04-08 MED ORDER — QUETIAPINE FUMARATE 50 MG PO TABS
50.0000 mg | ORAL_TABLET | Freq: Every day | ORAL | Status: DC
  Filled 2014-04-08: qty 1

## 2014-04-08 MED ORDER — QUETIAPINE FUMARATE 50 MG PO TABS
50.0000 mg | ORAL_TABLET | Freq: Three times a day (TID) | ORAL | Status: DC
  Administered 2014-04-08 – 2014-04-10 (×3): 50 mg via ORAL
  Filled 2014-04-08 (×3): qty 1

## 2014-04-08 MED ORDER — MEMANTINE HCL ER 7 MG PO CP24
28.0000 mg | ORAL_CAPSULE | Freq: Every morning | ORAL | Status: DC
  Administered 2014-04-09 – 2014-04-10 (×2): 28 mg via ORAL
  Filled 2014-04-08: qty 4

## 2014-04-08 NOTE — ED Notes (Signed)
Pt repositioned in bed. Decaf coffee given to patient.

## 2014-04-08 NOTE — BH Assessment (Signed)
Per Marnee Spring at [place] pt's file has not yet been reviewed for admission. They will contact us if he is being considered for placement.   No answer at [place], or [place] will call us back as she is busy at this time.   [facility] per [place] pt has not been reviewed yet for admission. She request we call back around 10 am.  Lear Ng, [facility] Triage Specialist 04/08/2014 8:53 PM

## 2014-04-08 NOTE — Progress Notes (Signed)
CSW received a call from [facility] with the state facility regulations who is requesting information on the discharge of the patient from the nursing home.  She was given the nurse contact information and she will return the call with an update.     Chesley Noon, MSW, [place], 04/08/2014 Evening Clinical Social Worker [phone]

## 2014-04-08 NOTE — Progress Notes (Signed)
CSW received a call back from [place] with the state facility regulator who reports that she spoke with the facility and the patient can return to the residing facility once he complete treatment with a geri-psych facility.  She reports if the facility do not take the patient back once he has completed his treatment to contact her for assistance.     Chesley Noon, MSW, [place], 04/08/2014 Evening Clinical Social Worker [phone]

## 2014-04-08 NOTE — BH Assessment (Signed)
TTS seeking placement.   Per Nevin Bloodgood no beds at [facility].  Faxed to [facility] as the may have discharges in morning per Kennyth Lose (OV) and Cyril Mourning [facility]).  Lear Ng, [facility] Triage Specialist 04/08/2014 3:08 AM

## 2014-04-08 NOTE — ED Notes (Addendum)
Belongings placed in locker number 29. One belonging consists of Marchio shirt and flannel pajama pants NO SHOES present

## 2014-04-08 NOTE — Progress Notes (Signed)
Gero-psych inptx has been reccommended; the following gero-psych facilities have been contacted regarding bed availability:  Mikel Cella- per Lonn Georgia at [facility]- per [place] at [facility]- per Abigail Butts at [facility]- per Jarrett Soho at [facility]- per Shirlee Limerick at [facility]- per Romie Minus can fax, referral faxed [facility]- per Deneise Lever currently at capacity and, "have an extensive wait list" [place]- per Nicole [facility] will be having d/c' but do not have programming for pt's dx with dementia [facility]- no answer but referral faxed for review   Rick Duff Disposition MHT
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
## 2014-04-09 ENCOUNTER — Encounter (HOSPITAL_COMMUNITY): Payer: Self-pay | Admitting: Registered Nurse

## 2014-04-09 DIAGNOSIS — F0281 Dementia in other diseases classified elsewhere with behavioral disturbance: Secondary | ICD-10-CM | POA: Diagnosis not present

## 2014-04-09 DIAGNOSIS — F02818 Dementia in other diseases classified elsewhere, unspecified severity, with other behavioral disturbance: Secondary | ICD-10-CM | POA: Diagnosis not present
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
## 2014-04-09 NOTE — Progress Notes (Signed)
3:00pm. CSW met with pt's son and daughter and law, Antony Haste and [employer]. CSW updated family that [facility] has become involved, have disputed the eviction, and will facilitate pt's return to [place]. CSW informed them that state wanted to make sure pt was psychiatrically stabilized and that our psychiatrists were working to provide this care for pt.   Rochele Pages,     ED CSW  phone: [phone]

## 2014-04-09 NOTE — Progress Notes (Addendum)
1:06pm. CSW left message for Tommi Rumps, [employer] representative.   5:15pm. CSW left message for Tommi Rumps, [employer] representative.   Rochele Pages,     ED CSW  phone: [phone]

## 2014-04-09 NOTE — Progress Notes (Signed)
The following gero-psych facilities have been contacted regarding bed availability for inptx:  [facility] per Margarita Grizzle have beds available but referral has not yet been reviewed. [facility]- per Lonn Georgia at [facility]- per Shirlee Limerick at [facility]- per Allison at [facility]- contacted several times but unable to get in contact with facility [facility]- per Tammy at [facility]- do not have programming for pt's dx with dementia [facility]- per Erasmo Downer at capacity and also do not have programming for pt's dx with dementia [facility]- per Jarrett Soho at [facility] Disposition [facility]

## 2014-04-09 NOTE — BH Assessment (Signed)
Dr. Wanda Plump. [facility] recommends Pt be referred to geriatric-psychiatric facility. Contacted the following facilities for placement:  INFORMATION ALREADY FAXED AND IS UNDER REVIEW: [facility], per [employer] [facility] [employer]  AT CAPACITY: [facility], per [facility], per [facility] per [facility], per [facility], per [facility], per [facility], per [facility], [facility], [facility] Triage Specialist [phone]

## 2014-04-09 NOTE — Progress Notes (Addendum)
The purpose of this note is to clarify collateral information that was included in Dr. Ovid Curd Pickering's H&P note.   Pt was NOT refused by [facility] due to safety issues. Per staff at [facility] [phone]) and [facility] [phone]) pt's residence, he could not go to [facility] because [facility] because [facility] was at capacity on Friday.   Pt is able to return to [facility] after d/c. Jenny Reichmann Deporter [phone]), with [employer], is working with facility to assist with his return.   Rochele Pages,     ED CSW  phone: [phone]

## 2014-04-10 ENCOUNTER — Encounter (HOSPITAL_COMMUNITY): Payer: Self-pay | Admitting: Psychiatry

## 2014-04-10 DIAGNOSIS — F0390 Unspecified dementia without behavioral disturbance: Secondary | ICD-10-CM

## 2014-04-10 DIAGNOSIS — F0281 Dementia in other diseases classified elsewhere with behavioral disturbance: Secondary | ICD-10-CM | POA: Diagnosis not present

## 2014-04-10 DIAGNOSIS — F02818 Dementia in other diseases classified elsewhere, unspecified severity, with other behavioral disturbance: Secondary | ICD-10-CM | POA: Diagnosis not present

## 2014-04-10 MED ORDER — QUETIAPINE FUMARATE 50 MG PO TABS: ORAL_TABLET | ORAL | Status: DC

## 2014-04-10 MED ORDER — MEMANTINE HCL ER 28 MG PO CP24: 28.0000 mg | ORAL_CAPSULE | Freq: Every morning | ORAL | Status: AC

## 2014-04-10 MED ORDER — OMEPRAZOLE 20 MG PO CPDR: 40.0000 mg | DELAYED_RELEASE_CAPSULE | Freq: Every day | ORAL | Status: AC

## 2014-04-10 MED ORDER — ATORVASTATIN CALCIUM 20 MG PO TABS: 10.0000 mg | ORAL_TABLET | Freq: Every day | ORAL | Status: AC

## 2014-04-10 MED ORDER — MELATONIN 3 MG PO TABS: 3.0000 mg | ORAL_TABLET | Freq: Every day | ORAL | Status: AC

## 2014-04-10 MED ORDER — AMLODIPINE BESYLATE 5 MG PO TABS: 5.0000 mg | ORAL_TABLET | Freq: Every day | ORAL | Status: AC

## 2014-04-10 MED ORDER — VALSARTAN 320 MG PO TABS: 320.0000 mg | ORAL_TABLET | Freq: Every day | ORAL | Status: AC

## 2014-04-10 NOTE — Clinical Social Work Note (Signed)
CSW updated by Psychiatry that patient is stable for dc and at baseline- Family asking for SNF placement to be pursued at [place]- I have submitted clinical information to [facility] and awaiting follow up for possible d/c.   Eduard Clos, MSW, [place]
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
## 2014-04-10 NOTE — ED Notes (Signed)
Report called to SNF. [facility] transport set up also.

## 2014-04-10 NOTE — Clinical Social Work Note (Signed)
CSW rec'd call from the Administrator at [facility] indicating they are more than willing to accept patient back- "we love him and just wanted him to get the help he needed"- I have advised her that he has had medication changes and has remained calm at this time- per Nursing staff, he ate his breakfast and has remained calm and nto aggressive or agitated- They are willing to take him back. I have advised the MD/NP and anticipate a d/c later today.   Eduard Clos, MSW, [place]

## 2014-04-10 NOTE — ED Notes (Signed)
Patient refusing vital signs and is agitated while awake. No s/s of distress noted. Respirations regular and unlabored.

## 2014-04-10 NOTE — Clinical Social Work Note (Signed)
Patient for d/c today back to [facility] as prior to this admission- family pleased and in agreement with this plan- I have advised [facility] as well- RN to call report and EMS transport.   Eduard Clos, MSW, [place]
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
## 2014-04-10 NOTE — Discharge Instructions (Signed)
Dementia °Dementia is a word that is used to describe problems with the brain and how it works. People with dementia have memory loss. They may also have problems with thinking, speaking, or solving problems. It can affect how they act around people, how they do their job, their mood, and their personality. These changes may not show up for a long time. Family or friends may not notice problems in the early part of this disease. °HOME CARE °The following tips are for the person living with, or caring for, the person with dementia. °Make the home safe. °· Remove locks on bathroom doors. °· Use childproof locks on cabinets where alcohol, cleaning supplies, or chemicals are stored. °· Put outlet covers in electrical outlets. °· Put in childproof locks to keep doors and windows safe. °· Remove stove knobs, or put in safety knobs that shut off on their own. °· Lower the temperature on water heaters. °· Label medicines. Lock them in a safe place. °· Keep knives, lighters, matches, power tools, and guns out of reach or in a safe place. °· Remove objects that might break or can hurt the person. °· Make sure lighting is good inside and outside. °· Put in grab bars if needed. °· Use a device that detects falls or other needs for help. °Lessen confusion. °· Keep familiar objects and people around. °· Use night lights or low lit (dim) lights at night. °· Label objects or areas. °· Use reminders, notes, or directions for daily activities or tasks. °· Keep a simple routine that is the same for waking, meals, bathing, dressing, and bedtime. °· Create a calm and quiet home. °· Put up clocks and calendars. °· Keep emergency numbers and the home address near all phones. °· Help show the different times of day. Open the curtains during the day to let light in. °Speak clearly and directly. °· Choose simple words and short sentences. °· Use a gentle, calm voice. °· Do not interrupt. °· If the person has a hard time finding a word to  use, give them the word or thought. °· Ask 1 question at a time. Give enough time for the person to answer. Repeat the question if the person does not answer. °Do things that lessen restlessness. °· Provide a comfortable bed. °· Have the same bedtime routine every night. °· Have a regular walking and activity schedule. °· Lessen naps during the day. °· Do not let the person drink a lot of caffeine. °· Go to events that are not overwhelming. °Eat well and drink fluids. °· Lessen distractions during meal times and snacks. °· Avoid foods that are too hot or too cold. °· Watch how the person chews and swallows. This is to make sure they do not choke. °Other °· Keep all vision, hearing, dental, and medical visits with the doctor. °· Only give medicines as told by the doctor. °· Watch the person's driving ability. Do not let the person drive if he or she cannot drive safely. °· Use a program that helps find a person if they become missing. You may need to register with this program. °GET HELP RIGHT AWAY IF:  °· A fever of 102° F (38.9° C) develops. °· Confusion develops or gets worse. °· Sleepiness develops or gets worse. °· Staying awake is hard to do. °· New behavior problems start like mood swings, aggression, and seeing things that are not there. °· Problems with balance, speech, or falling develop. °· Problems swallowing develop. °· Any   problems of another sickness develop. °MAKE SURE YOU: °· Understand these instructions. °· Will watch his or her condition. °· Will get help right away if he or she is not doing well or gets worse. °Document Released: 06/26/2008 Document Revised: 10/06/2011 Document Reviewed: 12/09/2010 °Exit[employer]® Patient Information ©2015 [employer]. This information is not intended to replace advice given to you by your health care provider. Make sure you discuss any questions you have with your health care provider. ° °

## 2014-04-14 ENCOUNTER — Encounter: Payer: PRIVATE HEALTH INSURANCE | Admitting: Internal Medicine

## 2014-05-19 ENCOUNTER — Ambulatory Visit (INDEPENDENT_AMBULATORY_CARE_PROVIDER_SITE_OTHER): Payer: PRIVATE HEALTH INSURANCE | Admitting: Internal Medicine

## 2014-05-19 ENCOUNTER — Encounter: Payer: Self-pay | Admitting: Internal Medicine

## 2014-05-19 VITALS — BP 81/46 | HR 66 | Ht 64.0 in | Wt 138.0 lb

## 2014-05-19 DIAGNOSIS — I350 Nonrheumatic aortic (valve) stenosis: Secondary | ICD-10-CM

## 2014-05-19 DIAGNOSIS — I442 Atrioventricular block, complete: Secondary | ICD-10-CM

## 2014-05-19 LAB — MDC_IDC_ENUM_SESS_TYPE_INCLINIC
Battery Impedance: 104 Ohm
Brady Statistic AP VP Percent: 63 %
Brady Statistic AP VS Percent: 0 %
Brady Statistic AS VS Percent: 0 %
Date Time Interrogation Session: 20151023121229
Lead Channel Impedance Value: 431 Ohm
Lead Channel Impedance Value: 560 Ohm
Lead Channel Pacing Threshold Amplitude: 0.75 V
Lead Channel Pacing Threshold Pulse Width: 0.4 ms
Lead Channel Pacing Threshold Pulse Width: 0.4 ms
Lead Channel Sensing Intrinsic Amplitude: 4 mV
Lead Channel Sensing Intrinsic Amplitude: 8 mV
Lead Channel Setting Pacing Pulse Width: 0.4 ms
Lead Channel Setting Sensing Sensitivity: 4 mV
MDC IDC MSMT BATTERY REMAINING LONGEVITY: 114 mo
MDC IDC MSMT BATTERY VOLTAGE: 2.79 V
MDC IDC MSMT LEADCHNL RA PACING THRESHOLD AMPLITUDE: 0.5 V
MDC IDC SET LEADCHNL RA PACING AMPLITUDE: 2 V
MDC IDC SET LEADCHNL RV PACING AMPLITUDE: 2.5 V
MDC IDC STAT BRADY AS VP PERCENT: 36 %
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
## 2014-05-19 NOTE — Patient Instructions (Signed)
Your physician recommends that you schedule a follow-up appointment in: 1 year with Dr. Rayann Heman. You will receive a reminder letter in the mail in about 10 months reminding you to call and schedule your appointment. If you don't receive this letter, please contact our office. Your physician recommends that you schedule a follow-up appointment in: 6 months in the device clinic. You will receive a reminder letter in the mail in about 4 months reminding you to call and schedule your appointment. If you don't receive this letter, please contact our office. Your physician recommends that you continue on your current medications as directed. Please refer to the Current Medication list given to you today.

## 2014-05-30 ENCOUNTER — Inpatient Hospital Stay (HOSPITAL_COMMUNITY)
Admission: EM | Admit: 2014-05-30 | Discharge: 2014-06-27 | DRG: 871 | Disposition: E | Payer: PRIVATE HEALTH INSURANCE | Attending: Pulmonary Disease | Admitting: Pulmonary Disease

## 2014-05-30 ENCOUNTER — Emergency Department (HOSPITAL_COMMUNITY): Payer: PRIVATE HEALTH INSURANCE

## 2014-05-30 ENCOUNTER — Encounter (HOSPITAL_COMMUNITY): Payer: Self-pay

## 2014-05-30 DIAGNOSIS — R64 Cachexia: Secondary | ICD-10-CM | POA: Diagnosis present

## 2014-05-30 DIAGNOSIS — E785 Hyperlipidemia, unspecified: Secondary | ICD-10-CM | POA: Diagnosis present

## 2014-05-30 DIAGNOSIS — Z87891 Personal history of nicotine dependence: Secondary | ICD-10-CM

## 2014-05-30 DIAGNOSIS — I2510 Atherosclerotic heart disease of native coronary artery without angina pectoris: Secondary | ICD-10-CM | POA: Diagnosis present

## 2014-05-30 DIAGNOSIS — I10 Essential (primary) hypertension: Secondary | ICD-10-CM | POA: Diagnosis present

## 2014-05-30 DIAGNOSIS — I080 Rheumatic disorders of both mitral and aortic valves: Secondary | ICD-10-CM | POA: Diagnosis present

## 2014-05-30 DIAGNOSIS — F0390 Unspecified dementia without behavioral disturbance: Secondary | ICD-10-CM | POA: Diagnosis present

## 2014-05-30 DIAGNOSIS — Z955 Presence of coronary angioplasty implant and graft: Secondary | ICD-10-CM

## 2014-05-30 DIAGNOSIS — A419 Sepsis, unspecified organism: Secondary | ICD-10-CM | POA: Diagnosis not present

## 2014-05-30 DIAGNOSIS — Z951 Presence of aortocoronary bypass graft: Secondary | ICD-10-CM

## 2014-05-30 DIAGNOSIS — R4182 Altered mental status, unspecified: Secondary | ICD-10-CM | POA: Diagnosis not present

## 2014-05-30 DIAGNOSIS — J9601 Acute respiratory failure with hypoxia: Secondary | ICD-10-CM | POA: Diagnosis present

## 2014-05-30 DIAGNOSIS — Z96651 Presence of right artificial knee joint: Secondary | ICD-10-CM | POA: Diagnosis present

## 2014-05-30 DIAGNOSIS — R6521 Severe sepsis with septic shock: Secondary | ICD-10-CM | POA: Diagnosis present

## 2014-05-30 DIAGNOSIS — R0603 Acute respiratory distress: Secondary | ICD-10-CM

## 2014-05-30 DIAGNOSIS — N179 Acute kidney failure, unspecified: Secondary | ICD-10-CM | POA: Diagnosis present

## 2014-05-30 DIAGNOSIS — Z66 Do not resuscitate: Secondary | ICD-10-CM | POA: Diagnosis present

## 2014-05-30 DIAGNOSIS — Z515 Encounter for palliative care: Secondary | ICD-10-CM

## 2014-05-30 DIAGNOSIS — G9340 Encephalopathy, unspecified: Secondary | ICD-10-CM | POA: Diagnosis present

## 2014-05-30 DIAGNOSIS — J690 Pneumonitis due to inhalation of food and vomit: Secondary | ICD-10-CM

## 2014-05-30 DIAGNOSIS — Z950 Presence of cardiac pacemaker: Secondary | ICD-10-CM

## 2014-05-30 DIAGNOSIS — Z7982 Long term (current) use of aspirin: Secondary | ICD-10-CM

## 2014-05-30 LAB — COMPREHENSIVE METABOLIC PANEL
ALK PHOS: 87 U/L (ref 39–117)
ALT: 10 U/L (ref 0–53)
ANION GAP: 11 (ref 5–15)
AST: 14 U/L (ref 0–37)
Albumin: 2.8 g/dL — ABNORMAL LOW (ref 3.5–5.2)
BILIRUBIN TOTAL: 0.3 mg/dL (ref 0.3–1.2)
BUN: 32 mg/dL — AB (ref 6–23)
CO2: 26 meq/L (ref 19–32)
Calcium: 8.8 mg/dL (ref 8.4–10.5)
Chloride: 106 mEq/L (ref 96–112)
Creatinine, Ser: 1.43 mg/dL — ABNORMAL HIGH (ref 0.50–1.35)
GFR, EST AFRICAN AMERICAN: 48 mL/min — AB (ref >90)
GFR, EST NON AFRICAN AMERICAN: 42 mL/min — AB (ref >90)
GLUCOSE: 147 mg/dL — AB (ref 70–99)
Potassium: 3.9 mEq/L (ref 3.7–5.3)
SODIUM: 143 meq/L (ref 137–147)
Total Protein: 5.7 g/dL — ABNORMAL LOW (ref 6.0–8.3)

## 2014-05-30 LAB — CBC WITH DIFFERENTIAL/PLATELET
Basophils Absolute: 0 10*3/uL (ref 0.0–0.1)
Basophils Relative: 0 % (ref 0–1)
Eosinophils Absolute: 0 10*3/uL (ref 0.0–0.7)
Eosinophils Relative: 0 % (ref 0–5)
HCT: 32.8 % — ABNORMAL LOW (ref 39.0–52.0)
Hemoglobin: 11 g/dL — ABNORMAL LOW (ref 13.0–17.0)
LYMPHS ABS: 0.8 10*3/uL (ref 0.7–4.0)
LYMPHS PCT: 15 % (ref 12–46)
MCH: 31.8 pg (ref 26.0–34.0)
MCHC: 33.5 g/dL (ref 30.0–36.0)
MCV: 94.8 fL (ref 78.0–100.0)
Monocytes Absolute: 0.2 10*3/uL (ref 0.1–1.0)
Monocytes Relative: 4 % (ref 3–12)
NEUTROS PCT: 81 % — AB (ref 43–77)
Neutro Abs: 4 10*3/uL (ref 1.7–7.7)
Platelets: 187 10*3/uL (ref 150–400)
RBC: 3.46 MIL/uL — AB (ref 4.22–5.81)
RDW: 14.9 % (ref 11.5–15.5)
WBC: 5 10*3/uL (ref 4.0–10.5)

## 2014-05-30 LAB — I-STAT CG4 LACTIC ACID, ED: Lactic Acid, Venous: 2.1 mmol/L (ref 0.5–2.2)

## 2014-05-30 MED ORDER — VANCOMYCIN HCL IN DEXTROSE 1-5 GM/200ML-% IV SOLN
1000.0000 mg | Freq: Once | INTRAVENOUS | Status: AC
  Administered 2014-05-31: 1000 mg via INTRAVENOUS
  Filled 2014-05-30: qty 200

## 2014-05-30 MED ORDER — MIDAZOLAM HCL 5 MG/5ML IJ SOLN
INTRAMUSCULAR | Status: DC | PRN
  Administered 2014-05-30: 2 mg via INTRAVENOUS
  Administered 2014-05-30: 5 mg via INTRAVENOUS

## 2014-05-30 MED ORDER — SODIUM CHLORIDE 0.9 % IV SOLN
INTRAVENOUS | Status: AC | PRN
  Administered 2014-05-30 (×2): 999 mL/h via INTRAVENOUS

## 2014-05-30 MED ORDER — MIDAZOLAM HCL 2 MG/2ML IJ SOLN
INTRAMUSCULAR | Status: AC
  Filled 2014-05-30: qty 6

## 2014-05-30 MED ORDER — SODIUM CHLORIDE 0.9 % IV BOLUS (SEPSIS)
1000.0000 mL | Freq: Once | INTRAVENOUS | Status: AC
  Administered 2014-05-30: 1000 mL via INTRAVENOUS

## 2014-05-30 MED ORDER — PIPERACILLIN-TAZOBACTAM 3.375 G IVPB 30 MIN
3.3750 g | Freq: Once | INTRAVENOUS | Status: AC
  Administered 2014-05-31: 3.375 g via INTRAVENOUS
  Filled 2014-05-30: qty 50

## 2014-05-30 MED ORDER — MIDAZOLAM HCL 2 MG/2ML IJ SOLN
INTRAMUSCULAR | Status: AC
  Administered 2014-05-30: 5 mg via INTRAVENOUS
  Filled 2014-05-30: qty 2

## 2014-05-30 MED ORDER — FENTANYL CITRATE 0.05 MG/ML IJ SOLN
INTRAMUSCULAR | Status: DC | PRN
  Administered 2014-05-30: 50 ug via INTRAVENOUS

## 2014-05-30 MED ORDER — NOREPINEPHRINE BITARTRATE 1 MG/ML IV SOLN
0.0000 ug/min | Freq: Once | INTRAVENOUS | Status: AC
  Administered 2014-05-30: 20 ug/min via INTRAVENOUS

## 2014-05-30 MED ORDER — SUCCINYLCHOLINE CHLORIDE 20 MG/ML IJ SOLN
INTRAMUSCULAR | Status: AC | PRN
  Administered 2014-05-30: 100 mg via INTRAVENOUS

## 2014-05-30 MED ORDER — ETOMIDATE 2 MG/ML IV SOLN
INTRAVENOUS | Status: AC | PRN
  Administered 2014-05-30: 20 mg via INTRAVENOUS

## 2014-05-30 MED ORDER — NOREPINEPHRINE BITARTRATE 1 MG/ML IV SOLN
0.0000 ug/min | Freq: Once | INTRAVENOUS | Status: AC
  Administered 2014-05-31: 40 ug/min via INTRAVENOUS
  Filled 2014-05-30: qty 4

## 2014-05-30 NOTE — ED Notes (Addendum)
Per EMS: PT from [facility]. Per staff, pt choked on hot dog this afternoon but "was able to finally swallow it." When staff went to give pt evening meds at 2130 to noted with decreased LOC and difficulty swallowing. On EMS arrival pt was unresponsive. Pt noted to have absent right sided lung sounds and clear, equal left sided lung sounds. O2 stats 76%. 68/205. 100 ST continous paced. Pt placed on BVM, right sided chest needle decompression performed. Pts O2 increased to 86%. Improved LOC with eyes open and making purposeful movement on arrival to ED. BP 190/8110. 95 ST.

## 2014-05-30 NOTE — Sedation Documentation (Signed)
Pt successfully intubated. Good color change. Breath sounds clear on left side.

## 2014-05-31 ENCOUNTER — Inpatient Hospital Stay (HOSPITAL_COMMUNITY): Payer: PRIVATE HEALTH INSURANCE

## 2014-05-31 DIAGNOSIS — G9340 Encephalopathy, unspecified: Secondary | ICD-10-CM | POA: Diagnosis present

## 2014-05-31 DIAGNOSIS — R6521 Severe sepsis with septic shock: Secondary | ICD-10-CM | POA: Diagnosis present

## 2014-05-31 DIAGNOSIS — A419 Sepsis, unspecified organism: Secondary | ICD-10-CM

## 2014-05-31 DIAGNOSIS — Z96651 Presence of right artificial knee joint: Secondary | ICD-10-CM | POA: Diagnosis present

## 2014-05-31 DIAGNOSIS — I2510 Atherosclerotic heart disease of native coronary artery without angina pectoris: Secondary | ICD-10-CM | POA: Diagnosis present

## 2014-05-31 DIAGNOSIS — I080 Rheumatic disorders of both mitral and aortic valves: Secondary | ICD-10-CM | POA: Diagnosis present

## 2014-05-31 DIAGNOSIS — Z87891 Personal history of nicotine dependence: Secondary | ICD-10-CM | POA: Diagnosis not present

## 2014-05-31 DIAGNOSIS — Z950 Presence of cardiac pacemaker: Secondary | ICD-10-CM | POA: Diagnosis not present

## 2014-05-31 DIAGNOSIS — Z955 Presence of coronary angioplasty implant and graft: Secondary | ICD-10-CM | POA: Diagnosis not present

## 2014-05-31 DIAGNOSIS — F0390 Unspecified dementia without behavioral disturbance: Secondary | ICD-10-CM

## 2014-05-31 DIAGNOSIS — Z951 Presence of aortocoronary bypass graft: Secondary | ICD-10-CM | POA: Diagnosis not present

## 2014-05-31 DIAGNOSIS — J690 Pneumonitis due to inhalation of food and vomit: Secondary | ICD-10-CM

## 2014-05-31 DIAGNOSIS — Z515 Encounter for palliative care: Secondary | ICD-10-CM | POA: Diagnosis not present

## 2014-05-31 DIAGNOSIS — I10 Essential (primary) hypertension: Secondary | ICD-10-CM | POA: Diagnosis present

## 2014-05-31 DIAGNOSIS — N179 Acute kidney failure, unspecified: Secondary | ICD-10-CM | POA: Diagnosis present

## 2014-05-31 DIAGNOSIS — R4182 Altered mental status, unspecified: Secondary | ICD-10-CM | POA: Diagnosis present

## 2014-05-31 DIAGNOSIS — Z66 Do not resuscitate: Secondary | ICD-10-CM | POA: Diagnosis present

## 2014-05-31 DIAGNOSIS — J9601 Acute respiratory failure with hypoxia: Secondary | ICD-10-CM | POA: Diagnosis present

## 2014-05-31 DIAGNOSIS — E785 Hyperlipidemia, unspecified: Secondary | ICD-10-CM | POA: Diagnosis present

## 2014-05-31 DIAGNOSIS — R64 Cachexia: Secondary | ICD-10-CM | POA: Diagnosis present

## 2014-05-31 DIAGNOSIS — Z7982 Long term (current) use of aspirin: Secondary | ICD-10-CM | POA: Diagnosis not present

## 2014-05-31 LAB — URINALYSIS, ROUTINE W REFLEX MICROSCOPIC
Bilirubin Urine: NEGATIVE
Glucose, UA: NEGATIVE mg/dL
Hgb urine dipstick: NEGATIVE
KETONES UR: NEGATIVE mg/dL
Leukocytes, UA: NEGATIVE
NITRITE: NEGATIVE
Protein, ur: NEGATIVE mg/dL
Specific Gravity, Urine: 1.019 (ref 1.005–1.030)
UROBILINOGEN UA: 1 mg/dL (ref 0.0–1.0)
pH: 6.5 (ref 5.0–8.0)

## 2014-05-31 LAB — I-STAT ARTERIAL BLOOD GAS, ED
ACID-BASE DEFICIT: 5 mmol/L — AB (ref 0.0–2.0)
Acid-base deficit: 5 mmol/L — ABNORMAL HIGH (ref 0.0–2.0)
Bicarbonate: 21.4 mEq/L (ref 20.0–24.0)
Bicarbonate: 22.7 mEq/L (ref 20.0–24.0)
O2 SAT: 86 %
O2 SAT: 100 %
TCO2: 23 mmol/L (ref 0–100)
TCO2: 24 mmol/L (ref 0–100)
pCO2 arterial: 39.4 mmHg (ref 35.0–45.0)
pCO2 arterial: 55 mmHg — ABNORMAL HIGH (ref 35.0–45.0)
pH, Arterial: 7.223 — ABNORMAL LOW (ref 7.350–7.450)
pH, Arterial: 7.325 — ABNORMAL LOW (ref 7.350–7.450)
pO2, Arterial: 245 mmHg — ABNORMAL HIGH (ref 80.0–100.0)
pO2, Arterial: 47 mmHg — ABNORMAL LOW (ref 80.0–100.0)

## 2014-05-31 LAB — CBC
HEMATOCRIT: 35.1 % — AB (ref 39.0–52.0)
HEMOGLOBIN: 11.4 g/dL — AB (ref 13.0–17.0)
MCH: 31.3 pg (ref 26.0–34.0)
MCHC: 32.5 g/dL (ref 30.0–36.0)
MCV: 96.4 fL (ref 78.0–100.0)
Platelets: 198 10*3/uL (ref 150–400)
RBC: 3.64 MIL/uL — ABNORMAL LOW (ref 4.22–5.81)
RDW: 14.9 % (ref 11.5–15.5)
WBC: 5.1 10*3/uL (ref 4.0–10.5)

## 2014-05-31 LAB — PRO B NATRIURETIC PEPTIDE: PRO B NATRI PEPTIDE: 248.8 pg/mL (ref 0–450)

## 2014-05-31 LAB — PROCALCITONIN: Procalcitonin: <0.1 ng/mL

## 2014-05-31 LAB — LACTIC ACID, PLASMA: LACTIC ACID, VENOUS: 2.4 mmol/L — AB (ref 0.5–2.2)

## 2014-05-31 LAB — CORTISOL: CORTISOL PLASMA: 32.6 ug/dL

## 2014-05-31 LAB — TROPONIN I: Troponin I: <0.3 ng/mL (ref <0.30)

## 2014-05-31 LAB — GLUCOSE, CAPILLARY: Glucose-Capillary: 230 mg/dL — ABNORMAL HIGH (ref 70–99)

## 2014-05-31 LAB — MRSA PCR SCREENING: MRSA by PCR: NEGATIVE

## 2014-05-31 MED ORDER — IPRATROPIUM-ALBUTEROL 0.5-2.5 (3) MG/3ML IN SOLN: 3.0000 mL | RESPIRATORY_TRACT | Status: DC | PRN

## 2014-05-31 MED ORDER — FENTANYL CITRATE 0.05 MG/ML IJ SOLN
INTRAMUSCULAR | Status: AC
  Filled 2014-05-31: qty 2

## 2014-05-31 MED ORDER — SODIUM CHLORIDE 0.9 % IV SOLN: INTRAVENOUS | Status: DC

## 2014-05-31 MED ORDER — SODIUM CHLORIDE 0.9 % IV SOLN
25.0000 ug/h | INTRAVENOUS | Status: DC
  Administered 2014-05-31: 25 ug/h via INTRAVENOUS
  Filled 2014-05-31: qty 50

## 2014-05-31 MED ORDER — MIDAZOLAM HCL 2 MG/2ML IJ SOLN
1.0000 mg | INTRAMUSCULAR | Status: DC | PRN
  Administered 2014-05-31: 1 mg via INTRAVENOUS

## 2014-05-31 MED ORDER — HEPARIN SODIUM (PORCINE) 5000 UNIT/ML IJ SOLN
5000.0000 [IU] | Freq: Three times a day (TID) | INTRAMUSCULAR | Status: DC
  Administered 2014-05-31: 5000 [IU] via SUBCUTANEOUS
  Filled 2014-05-31: qty 1

## 2014-05-31 MED ORDER — PANTOPRAZOLE SODIUM 40 MG IV SOLR
40.0000 mg | Freq: Every day | INTRAVENOUS | Status: DC
  Administered 2014-05-31: 40 mg via INTRAVENOUS
  Filled 2014-05-31: qty 40

## 2014-05-31 MED ORDER — FENTANYL CITRATE 0.05 MG/ML IJ SOLN: 50.0000 ug | INTRAMUSCULAR | Status: DC | PRN

## 2014-05-31 MED ORDER — MIDAZOLAM HCL 2 MG/2ML IJ SOLN
INTRAMUSCULAR | Status: AC
  Filled 2014-05-31: qty 2

## 2014-05-31 MED ORDER — MIDAZOLAM HCL 2 MG/2ML IJ SOLN: 2.0000 mg | INTRAMUSCULAR | Status: DC | PRN

## 2014-05-31 MED ORDER — PIPERACILLIN-TAZOBACTAM 3.375 G IVPB
3.3750 g | Freq: Three times a day (TID) | INTRAVENOUS | Status: DC
  Filled 2014-05-31 (×2): qty 50

## 2014-05-31 MED ORDER — SODIUM CHLORIDE 0.9 % IV SOLN
250.0000 mL | INTRAVENOUS | Status: DC | PRN
  Administered 2014-05-31: 250 mL via INTRAVENOUS

## 2014-05-31 MED ORDER — FENTANYL CITRATE 0.05 MG/ML IJ SOLN
50.0000 ug | INTRAMUSCULAR | Status: DC | PRN
  Administered 2014-05-31: 50 ug via INTRAVENOUS
  Filled 2014-05-31: qty 2

## 2014-05-31 MED ORDER — NOREPINEPHRINE BITARTRATE 1 MG/ML IV SOLN
2.0000 ug/min | INTRAVENOUS | Status: DC
  Administered 2014-05-31: 50 ug/min via INTRAVENOUS

## 2014-05-31 MED ORDER — MIDAZOLAM HCL 2 MG/2ML IJ SOLN
1.0000 mg | INTRAMUSCULAR | Status: DC | PRN
  Administered 2014-05-31: 1 mg via INTRAVENOUS
  Filled 2014-05-31: qty 2

## 2014-05-31 MED ORDER — NOREPINEPHRINE BITARTRATE 1 MG/ML IV SOLN
0.0000 ug/min | INTRAVENOUS | Status: DC
  Administered 2014-05-31: 40 ug/min via INTRAVENOUS
  Filled 2014-05-31: qty 16

## 2014-06-06 LAB — CULTURE, BLOOD (ROUTINE X 2)
CULTURE: NO GROWTH
CULTURE: NO GROWTH

## 2014-06-06 NOTE — Discharge Summary (Signed)
NAME:  [name], [name]                      ACCOUNT NO.:  MEDICAL RECORD NO.:  [id number]  LOCATION:                                 FACILITY:  PHYSICIAN:  [place] OF BIRTH:  [id number]  DATE OF ADMISSION:  2014/06/01 DATE OF DISCHARGE:  06-01-14                              DISCHARGE SUMMARY   DEATH SUMMARY  The cause of death is: 1. Aspiration pneumonia. 2. Septic shock. 3. Acute respiratory failure.  This is a 80 year old male with a past medical history significant for dementia who lived in a nursing home.  He was admitted to [facility] on 06/01/2014, for aspiration pneumonia with acute respiratory failure.  Please see the admission H and P for further details.  Past medical history, family history, social history, see the admission H and P.  PHYSICAL EXAMINATION:  VITAL SIGNS:  On admission, pulse rate 80, respirations 27, blood pressure 102/53, SpO2 99%, FiO2 100%. GENERAL:  Cachectic male, on vent. NEUROLOGIC:  Sedated, intermittently agitated. HEENT:  [place]/AT.  Pupils 10-point.  No JVD. CARDIOVASCULAR:  Regular rate and rhythm.  No murmurs, gallops, or rubs. LUNGS:  Decreased lung sounds in the right field, otherwise clear. ABDOMEN:  Soft, nondistended. MUSCULOSKELETAL:  No deformity, 2+ pitting edema bilaterally. SKIN:  Intact.  HOSPITAL COURSE:  The patient was initially brought to the emergency department with concern for pneumothorax because he had aspirated on food at his nursing home.  The chest tube was placed by the emergency room physician after needle decompression had been performed in the field.  However, it should be noted that there was no pneumothorax ever seen.  It was clear aspiration pneumonia in the right lower lobe.  The patient was admitted for acute respiratory failure and admitted to the ICU for further management.  He developed worsening septic shock during his brief stay in the ICU.  We discussed goals  of care with the family considering the patient's advanced dementia, nursing home status.  They stated that they only wanted him to be comfortable and did not want aggressive care. Therefore, not long after admission to the ICU, we changed our goals of care to full comfort measures only.  He was extubated when all family was present at the bedside and he died peacefully with them.          ______________________________ Norlene Campbell, MD     DBM/MEDQ  D:  06/06/2014  T:  06/06/2014  Job:  [id number]

## 2014-06-27 NOTE — ED Provider Notes (Signed)
Patient presents to the emergency room from a nursing facility for altered mental status and dyspnea. According to the staff at the facility the patient had a choking episode this afternoon but was then able to swallow food and apparently was fine for the rest of the evening. At approximately 2130 the patient had decreased level of consciousness and some issues with dyspnea. EMS was contacted. They found the patient to be hypoxic and hypotensive. He had absent breath sounds on the right side. They empirically placed a 14-gauge Angiocath in the right anterior chest wall. The patient arrived to the ED minimally responsive. Physical Exam  BP 102/53 mmHg  Pulse 80  Resp 27  SpO2 99%  Physical Exam  Constitutional: No distress.  Elderly, frail  HENT:  Head: Normocephalic and atraumatic.  Right Ear: External ear normal.  Left Ear: External ear normal.  Mouth/Throat: No oropharyngeal exudate.  Mucous membranes dry  Eyes: Conjunctivae are normal. Right eye exhibits no discharge. Left eye exhibits no discharge. No scleral icterus.  Neck: Neck supple. No tracheal deviation present.  Cardiovascular: Normal rate.   Pulses are weak  Pulmonary/Chest: No stridor. Tachypnea noted.  Labored breathing with absent breath sounds on the right side, Angiocath noted on the right anterior chest wall  Abdominal: He exhibits no distension. There is no tenderness. There is no rebound.  Musculoskeletal: He exhibits no edema.  Neurological: Cranial nerve deficit: no gross deficits. GCS eye subscore is 4. GCS verbal subscore is 3. GCS motor subscore is 4.  Skin: Skin is warm and dry. No rash noted.  Psychiatric: He has a normal mood and affect.  Nursing note and vitals reviewed.   ED Course  Procedures Patient had a chest tube placed and was endotracheal intubated. This was performed by Dr. Lovena Le. I was present during the entire procedure and assisted with both procedures.  CRITICAL CARE Performed by:  RSWNI,OEV Total critical care time: 45 Critical care time was exclusive of separately billable procedures and treating other patients. Critical care was necessary to treat or prevent imminent or life-threatening deterioration. Critical care was time spent personally by me on the following activities: development of treatment plan with patient and/or surrogate as well as nursing, discussions with consultants, evaluation of patient's response to treatment, examination of patient, obtaining history from patient or surrogate, ordering and performing treatments and interventions, ordering and review of laboratory studies, ordering and review of radiographic studies, pulse oximetry and re-evaluation of patient's condition.  MDM Patient had a chest tube placed as he already had a needle decompression performed by EMS. He was also intubated for airway control. The patient's oxygenation improved.  Patient was empirically started on antibiotics.he continued to be hypotensive despite fluid resuscitation. If at drip was initiated. Critical care was contacted for admission and further treatment.      Dorie Rank, MD 2014/06/30 [phone]
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
## 2014-06-27 NOTE — ED Notes (Signed)
Attempted report 

## 2014-06-27 NOTE — Progress Notes (Signed)
CARE MANAGEMENT NOTE June 30, 2014  Patient:  [name], [name]   Account Number:  [id number]  Date Initiated:  Jun 30, 2014  Documentation initiated by:  [facility]  Subjective/Objective Assessment:     Action/Plan:   Anticipated DC Date:  06/30/14   Anticipated DC Plan:  EXPIRED      DC Planning Services  CM consult      Choice offered to / List presented to:             Status of service:  Completed, signed off Medicare Important Message given?   (If response is "NO", the following Medicare IM given date fields will be blank) Date Medicare IM given:   Medicare IM given by:   Date Additional Medicare IM given:   Additional Medicare IM given by:    Discharge Disposition:  EXPIRED  Per UR Regulation:    If discussed at Long Length of Stay Meetings, dates discussed:    Comments:  UR completed. Jonnie Finner RN CCM Case Mgmt

## 2014-06-27 NOTE — Progress Notes (Signed)
[facility] Progress Note Patient Name: [name] DOB: 1922/05/07 MRN: [id number]   Date of Service  06-24-14  HPI/Events of Note  Family ready to proceed with discontinuation of vent support and full comfort care  eICU Interventions  Protocol ordered     Intervention Category Major Interventions: End of life / care limitation discussion  Merton Border 2014/06/24, 5:09 AM

## 2014-06-27 NOTE — Progress Notes (Signed)
[facility]  [name] has been in refractory shock since his arrival to the unit.  Considering his dementia and advanced age I discussed the situation with his family.  They feel that we should just proceed with comfort measures alone at this point.  There are more family that need to come back in, so once they arrive we will withdraw care.  Roselie Awkward, MD [place] [facility] Pager: [phone] Cell: [phone] If no response, call [phone]
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
## 2014-06-27 NOTE — ED Notes (Signed)
Critical Care PA-C at bedside talking with family

## 2014-06-27 NOTE — Progress Notes (Signed)
Received order for termination of life support.  Pt given meds by RN at bedside for comfort before vent withdrawal initiated per protocol.  At 0518, vent changed to cpap/ps 15/5 60%.  At 0523, vent changed to cpap/ps 5/5 30%.  Pt appeared comfortable and was extubated to room air per protocol.  RN aware, at bedside.

## 2014-06-27 DEATH — deceased

## 2014-07-06 ENCOUNTER — Encounter (HOSPITAL_COMMUNITY): Payer: Self-pay | Admitting: Cardiology
